# Patient Record
Sex: Female | Born: 1950 | Race: White | Hispanic: No | Marital: Single | Smoking: Never smoker
Health system: Southern US, Community
[De-identification: ages and names within clinical notes are randomized; demographics above are authoritative.]

## PROBLEM LIST (undated history)

## (undated) DIAGNOSIS — M1990 Unspecified osteoarthritis, unspecified site: Secondary | ICD-10-CM

## (undated) DIAGNOSIS — I10 Essential (primary) hypertension: Secondary | ICD-10-CM

## (undated) DIAGNOSIS — R0602 Shortness of breath: Secondary | ICD-10-CM

## (undated) DIAGNOSIS — D494 Neoplasm of unspecified behavior of bladder: Secondary | ICD-10-CM

## (undated) DIAGNOSIS — R319 Hematuria, unspecified: Secondary | ICD-10-CM

## (undated) DIAGNOSIS — R55 Syncope and collapse: Principal | ICD-10-CM

## (undated) DIAGNOSIS — N179 Acute kidney failure, unspecified: Secondary | ICD-10-CM

## (undated) DIAGNOSIS — E7800 Pure hypercholesterolemia, unspecified: Secondary | ICD-10-CM

## (undated) DIAGNOSIS — C679 Malignant neoplasm of bladder, unspecified: Secondary | ICD-10-CM

## (undated) HISTORY — DX: Malignant neoplasm of bladder, unspecified: C67.9

---

## 1998-05-22 ENCOUNTER — Emergency Department (HOSPITAL_COMMUNITY): Admission: EM | Admit: 1998-05-22 | Discharge: 1998-05-22 | Payer: Self-pay | Admitting: Emergency Medicine

## 1998-05-28 ENCOUNTER — Encounter: Admission: RE | Admit: 1998-05-28 | Discharge: 1998-05-28 | Payer: Self-pay | Admitting: Obstetrics

## 1998-05-28 ENCOUNTER — Other Ambulatory Visit: Admission: RE | Admit: 1998-05-28 | Discharge: 1998-05-28 | Payer: Self-pay | Admitting: Obstetrics

## 1998-06-03 ENCOUNTER — Encounter: Admission: RE | Admit: 1998-06-03 | Discharge: 1998-06-03 | Payer: Self-pay | Admitting: Hematology and Oncology

## 1998-07-03 ENCOUNTER — Encounter: Admission: RE | Admit: 1998-07-03 | Discharge: 1998-07-03 | Payer: Self-pay | Admitting: Internal Medicine

## 1998-08-27 ENCOUNTER — Encounter: Admission: RE | Admit: 1998-08-27 | Discharge: 1998-08-27 | Payer: Self-pay | Admitting: Hematology and Oncology

## 1998-09-10 ENCOUNTER — Encounter: Admission: RE | Admit: 1998-09-10 | Discharge: 1998-09-10 | Payer: Self-pay | Admitting: Internal Medicine

## 1999-06-13 ENCOUNTER — Emergency Department (HOSPITAL_COMMUNITY): Admission: EM | Admit: 1999-06-13 | Discharge: 1999-06-13 | Payer: Self-pay | Admitting: Emergency Medicine

## 2000-04-18 ENCOUNTER — Encounter: Admission: RE | Admit: 2000-04-18 | Discharge: 2000-04-18 | Payer: Self-pay | Admitting: Obstetrics & Gynecology

## 2000-06-13 ENCOUNTER — Encounter: Admission: RE | Admit: 2000-06-13 | Discharge: 2000-06-13 | Payer: Self-pay | Admitting: Internal Medicine

## 2000-06-29 ENCOUNTER — Encounter: Payer: Self-pay | Admitting: Emergency Medicine

## 2000-06-29 ENCOUNTER — Emergency Department (HOSPITAL_COMMUNITY): Admission: EM | Admit: 2000-06-29 | Discharge: 2000-06-29 | Payer: Self-pay | Admitting: Emergency Medicine

## 2001-05-01 ENCOUNTER — Encounter: Admission: RE | Admit: 2001-05-01 | Discharge: 2001-05-01 | Payer: Self-pay | Admitting: Obstetrics & Gynecology

## 2003-05-28 ENCOUNTER — Encounter: Payer: Self-pay | Admitting: Nephrology

## 2003-05-28 ENCOUNTER — Encounter: Admission: RE | Admit: 2003-05-28 | Discharge: 2003-05-28 | Payer: Self-pay | Admitting: Nephrology

## 2003-06-18 ENCOUNTER — Other Ambulatory Visit: Admission: RE | Admit: 2003-06-18 | Discharge: 2003-06-18 | Payer: Self-pay | Admitting: Nephrology

## 2003-07-11 ENCOUNTER — Encounter: Payer: Self-pay | Admitting: Nephrology

## 2003-07-11 ENCOUNTER — Ambulatory Visit (HOSPITAL_COMMUNITY): Admission: RE | Admit: 2003-07-11 | Discharge: 2003-07-11 | Payer: Self-pay | Admitting: Nephrology

## 2003-09-09 ENCOUNTER — Ambulatory Visit (HOSPITAL_COMMUNITY): Admission: RE | Admit: 2003-09-09 | Discharge: 2003-09-09 | Payer: Self-pay | Admitting: Nephrology

## 2003-09-26 ENCOUNTER — Ambulatory Visit (HOSPITAL_COMMUNITY): Admission: RE | Admit: 2003-09-26 | Discharge: 2003-09-26 | Payer: Self-pay | Admitting: Gastroenterology

## 2011-12-02 ENCOUNTER — Other Ambulatory Visit: Payer: Self-pay

## 2011-12-02 ENCOUNTER — Inpatient Hospital Stay (HOSPITAL_COMMUNITY): Payer: Self-pay

## 2011-12-02 ENCOUNTER — Emergency Department (HOSPITAL_COMMUNITY): Payer: Self-pay

## 2011-12-02 ENCOUNTER — Encounter (HOSPITAL_COMMUNITY): Payer: Self-pay | Admitting: Emergency Medicine

## 2011-12-02 ENCOUNTER — Inpatient Hospital Stay (HOSPITAL_COMMUNITY)
Admission: EM | Admit: 2011-12-02 | Discharge: 2011-12-06 | DRG: 982 | Disposition: A | Discharge status: Home / Self Care | Payer: Self-pay | Attending: Family Medicine | Admitting: Family Medicine

## 2011-12-02 ENCOUNTER — Encounter: Payer: Self-pay | Admitting: *Deleted

## 2011-12-02 ENCOUNTER — Emergency Department (INDEPENDENT_AMBULATORY_CARE_PROVIDER_SITE_OTHER)
Admission: EM | Admit: 2011-12-02 | Discharge: 2011-12-02 | Disposition: A | Discharge status: Nursing Home (Includes ICF) | Payer: Self-pay | Source: Home / Self Care | Attending: Emergency Medicine | Admitting: Emergency Medicine

## 2011-12-02 DIAGNOSIS — R55 Syncope and collapse: Secondary | ICD-10-CM

## 2011-12-02 DIAGNOSIS — E7800 Pure hypercholesterolemia, unspecified: Secondary | ICD-10-CM | POA: Diagnosis present

## 2011-12-02 DIAGNOSIS — E785 Hyperlipidemia, unspecified: Secondary | ICD-10-CM

## 2011-12-02 DIAGNOSIS — W010XXA Fall on same level from slipping, tripping and stumbling without subsequent striking against object, initial encounter: Secondary | ICD-10-CM | POA: Diagnosis present

## 2011-12-02 DIAGNOSIS — G911 Obstructive hydrocephalus: Principal | ICD-10-CM | POA: Diagnosis present

## 2011-12-02 DIAGNOSIS — Y9301 Activity, walking, marching and hiking: Secondary | ICD-10-CM

## 2011-12-02 DIAGNOSIS — S5290XD Unspecified fracture of unspecified forearm, subsequent encounter for closed fracture with routine healing: Secondary | ICD-10-CM

## 2011-12-02 DIAGNOSIS — I10 Essential (primary) hypertension: Secondary | ICD-10-CM

## 2011-12-02 DIAGNOSIS — Y92410 Unspecified street and highway as the place of occurrence of the external cause: Secondary | ICD-10-CM

## 2011-12-02 DIAGNOSIS — Y998 Other external cause status: Secondary | ICD-10-CM

## 2011-12-02 DIAGNOSIS — S52509A Unspecified fracture of the lower end of unspecified radius, initial encounter for closed fracture: Secondary | ICD-10-CM | POA: Diagnosis present

## 2011-12-02 HISTORY — DX: Essential (primary) hypertension: I10

## 2011-12-02 HISTORY — DX: Unspecified osteoarthritis, unspecified site: M19.90

## 2011-12-02 HISTORY — DX: Pure hypercholesterolemia, unspecified: E78.00

## 2011-12-02 LAB — BASIC METABOLIC PANEL
BUN: 15 mg/dL (ref 6–23)
CO2: 22 mEq/L (ref 19–32)
Calcium: 8.6 mg/dL (ref 8.4–10.5)
Chloride: 105 mEq/L (ref 96–112)
Creatinine, Ser: 0.77 mg/dL (ref 0.50–1.10)
GFR calc Af Amer: >90 mL/min (ref >90)
GFR calc non Af Amer: 89 mL/min — ABNORMAL LOW (ref >90)
Glucose, Bld: 118 mg/dL — ABNORMAL HIGH (ref 70–99)
Potassium: 3.7 mEq/L (ref 3.5–5.1)
Sodium: 138 mEq/L (ref 135–145)

## 2011-12-02 LAB — MAGNESIUM: Magnesium: 1.9 mg/dL (ref 1.5–2.5)

## 2011-12-02 LAB — CBC
HCT: 39.1 % (ref 36.0–46.0)
Hemoglobin: 13.2 g/dL (ref 12.0–15.0)
MCH: 28.8 pg (ref 26.0–34.0)
MCHC: 33.8 g/dL (ref 30.0–36.0)
MCV: 85.4 fL (ref 78.0–100.0)
Platelets: 255 10*3/uL (ref 150–400)
RBC: 4.58 MIL/uL (ref 3.87–5.11)
RDW: 14.1 % (ref 11.5–15.5)
WBC: 9.5 10*3/uL (ref 4.0–10.5)

## 2011-12-02 LAB — CARDIAC PANEL(CRET KIN+CKTOT+MB+TROPI)
CK, MB: 2.1 ng/mL (ref 0.3–4.0)
Relative Index: INVALID (ref 0.0–2.5)
Total CK: 90 U/L (ref 7–177)
Troponin I: <0.3 ng/mL (ref <0.30)

## 2011-12-02 LAB — GLUCOSE, CAPILLARY: Glucose-Capillary: 125 mg/dL — ABNORMAL HIGH (ref 70–99)

## 2011-12-02 LAB — POCT I-STAT TROPONIN I: Troponin i, poc: 0 ng/mL (ref 0.00–0.08)

## 2011-12-02 MED ORDER — SODIUM CHLORIDE 0.9 % IV BOLUS (SEPSIS)
1000.0000 mL | Freq: Once | INTRAVENOUS | Status: AC
  Administered 2011-12-02: 1000 mL via INTRAVENOUS

## 2011-12-02 MED ORDER — ACETAMINOPHEN 325 MG PO TABS: 650.0000 mg | ORAL_TABLET | Freq: Four times a day (QID) | ORAL | Status: DC | PRN

## 2011-12-02 MED ORDER — ACETAMINOPHEN 650 MG RE SUPP: 650.0000 mg | Freq: Four times a day (QID) | RECTAL | Status: DC | PRN

## 2011-12-02 MED ORDER — HYDROMORPHONE HCL PF 1 MG/ML IJ SOLN: 1.0000 mg | INTRAMUSCULAR | Status: DC | PRN

## 2011-12-02 MED ORDER — MORPHINE SULFATE 4 MG/ML IJ SOLN: 4.0000 mg | Freq: Once | INTRAMUSCULAR | Status: DC

## 2011-12-02 MED ORDER — ONDANSETRON HCL 4 MG/2ML IJ SOLN: 4.0000 mg | Freq: Three times a day (TID) | INTRAMUSCULAR | Status: AC | PRN

## 2011-12-02 MED ORDER — HYDROCHLOROTHIAZIDE 25 MG PO TABS
12.5000 mg | ORAL_TABLET | Freq: Every day | ORAL | Status: DC
  Filled 2011-12-02: qty 0.5

## 2011-12-02 MED ORDER — SODIUM CHLORIDE 0.9 % IV SOLN
INTRAVENOUS | Status: AC
  Administered 2011-12-02 – 2011-12-03 (×2): via INTRAVENOUS

## 2011-12-02 MED ORDER — IBUPROFEN 200 MG PO TABS
400.0000 mg | ORAL_TABLET | Freq: Once | ORAL | Status: AC
  Administered 2011-12-02: 400 mg via ORAL
  Filled 2011-12-02: qty 2

## 2011-12-02 MED ORDER — HYDROCHLOROTHIAZIDE 12.5 MG PO CAPS
12.5000 mg | ORAL_CAPSULE | Freq: Every day | ORAL | Status: DC
  Administered 2011-12-02 – 2011-12-06 (×5): 12.5 mg via ORAL
  Filled 2011-12-02 (×5): qty 1

## 2011-12-02 MED ORDER — TRAMADOL HCL 50 MG PO TABS
50.0000 mg | ORAL_TABLET | Freq: Four times a day (QID) | ORAL | Status: DC | PRN
  Filled 2011-12-02: qty 1

## 2011-12-02 NOTE — ED Notes (Signed)
Place Pt on heart monitor and on o2 with nasal cannula 2l/m Per MD

## 2011-12-02 NOTE — ED Notes (Signed)
Placed  On  cardiac  monitot  Nasal  o2   At 2  l  /  Min     l  Wrist  Splint  Temporarily  Applied

## 2011-12-02 NOTE — H&P (Signed)
Family Medicine Teaching [facility] Admission History and Physical  Patient name: Taylor Foley Medical record number: [id number] Date of birth: Dec 09, 1950 Age: 60 y.o. Gender: female  Primary Care Provider: unassigned.  Chief Complaint: syncope episode. History of Present Illness: Taylor Foley is a 60 y.o.female comes to ED today after a syncopal episode and a fall. Pt was walking after lunch and had a "black out" when she woke up she was on  the sidewalk (grass/bushes side) and her wrist was very painful. She was oriented and able to call for help and was aid to stand up walked approximately 4 blocks to were her sister lives and explained what happened. After that she was seen in urgent care services who apply an orthotic to her wrist and send her to [facility] ED were she is evaluated and admitted to our service. Pt does not recall dizziness, diaphoresis, palpitations, chest pain, change in vision or difficulty moving her extremities or before or after the episode. No nausea or vomiting. No changes on her urinary or BM habits.   Past Medical History: Past Medical History  Diagnosis Date  . Hypertension   . Hypercholesteremia     Past Surgical History: History reviewed. No pertinent past surgical history.  Social History: History   Lives with son. Unemployed. Helps serving food in shelters.  Social History Main Topics  . Smoking status: Never Smoker   . Smokeless tobacco: None  . Alcohol Use: No  . Drug Use: No  . Sexually Active: No    Family History: History reviewed. No pertinent family history.  Allergies: No Known Allergies   Review Of Systems: Per HPI . Positive for intermittent dizziness, right arm pain that resolved completely 4 months ago. Headaches on the right sided of her head for about a year.  Physical Exam: Filed Vitals:   12/02/11 1927  BP: 156/86  Pulse: 87  Temp: 98.5 F (36.9 C)  Resp: 20   General: alert, cooperative,  appears stated age and no distress HEENT: extra ocular movement intact, sclera clear, anicteric and neck supple with midline trachea Ears: normal extremal auditory canal bilaterally. Presence of  cerumen. TM normal bilaterally Heart: Normal S1S2. RRR. No murmurs, gallops or rubs. No JVD. Lungs: Unlabored breathing,  clear to auscultation, no wheezes or rales,  Abdomen: abdomen is soft without significant tenderness, masses, organomegaly or guarding Extremities: Left wrist with edema, deformity and decreased ROM. Right knee escoriation. No pitting edema.  Neurology: normal without focal findings, mental status, speech normal, alert and oriented x3, fundi are normal, cranial nerves 2-12 intact, reflexes normal and symmetric, sensation grossly normal, finger to nose and cerebellar exam normal and no tremors, cogwheeling or rigidity noted Gait not explored.   Labs and Imaging: Lab Results  Component Value Date/Time   NA 138 12/02/2011  2:24 PM   K 3.7 12/02/2011  2:24 PM   CL 105 12/02/2011  2:24 PM   CO2 22 12/02/2011  2:24 PM   BUN 15 12/02/2011  2:24 PM   CREATININE 0.77 12/02/2011  2:24 PM   GLUCOSE 118* 12/02/2011  2:24 PM   Lab Results  Component Value Date   WBC 9.5 12/02/2011   HGB 13.2 12/02/2011   HCT 39.1 12/02/2011   MCV 85.4 12/02/2011   PLT 255 12/02/2011    Assessment and Plan: Taylor Foley is a 60 y.o. female presenting with syncopal episode and a fall that caused left wrist fracture. 1. Syncope: Pt had episodes of dizziness in the  past that had been resolved by sitting down. Also has a hx of HTN and HLD not treated for 8 years. No chest pain, palpitations, diaphoresis, around the event, or present in ROS. Had mentioned ocasional headache right sided not associated with nausea/ vomiting or vision changes. No changes on hearing. The differential is broad but with this PMH of not medical care for many years cardiovascular and neurologic causes are priority. The most  common cause is vasovagal, orthostasis, arrhythmias and neurovascular as few to consider.  Plan: -Admit on telemetry floor with cardiac monitoring. -Orthostatics on admission.  -Cardiac Enzymes -ECHO 2.   Fall: left wrist fracture and right leg(knee) excoriation. Also pt mention she has headache and is not clear if she hit her head with the fall, no signs of head trauma and Neurologically she is intact but will order CT to r/o intracranial bleeding. Plan: -Ortho Dr. Ophelia Charter was consulted by ED but we will double check and make sure a consult is placed. Will follow ortho recs - Pain management with ibuprofen and tramadol 3.  HTN: Not treated for several years  -Will start with HCTZ and will monitor and adjust treatment. 4.  HLD:  -Will check lipid panel. 5. FEN/GI:healthy diet and NPO after midnight. 6. Prophylaxis: SCD's if no bleeding confirmed by stable CBC and negative CT scan, evaluate for heparin s/c 5000 units TID. 7. Disposition: Pending improvement and social Investment banker, operational help with financial situation regarding medical care.   D. Piloto Sherron Flemings Paz. MD PGY-1 FM  Pager [phone]   PGY-2 Addendum  I have seen and evaluated the patient and agree with above findings, assessment and plan.  In addition spoke to patient about obtaining orange card for financial assistance and informed of [facility] since she does not have a PCP.  Will have CSW follow along.

## 2011-12-02 NOTE — ED Notes (Signed)
Patient transported to X-ray 

## 2011-12-02 NOTE — ED Notes (Signed)
3003-01 READY

## 2011-12-02 NOTE — ED Notes (Signed)
Pt from [facility] c/o syncopal episode today while walking home from soup kitchen; pt denies dizziness prior to event; pt with deformity to left wrist; CMS intact; pt sts woke up on ground and was able to get back up and walk to sisters house; pt sts dizziness intermittently x several months with pain in right side of neck to right arm at times; pt also wants to speak to social worker

## 2011-12-02 NOTE — ED Notes (Signed)
Pt    Apparently   Passed  Out  About  1  Hr   Ago        Fell  And  inj  Her  l  Arm        l  Wrist  Is  swollen  And  rom  Is  Limited     Abrasion      To  l  Hand       Pt  Apparently  Landed  On l  Arm    At this  Time  Pt is  Awake  And  Alert       Neuro  Intact    denys  Any  Chest    Pain   At  This  Time no  Shortness  Of  Breath           Skin is  Warm  Dry      Pt  Apparently    Was   Walking  Back  From  [employer]       And  [employer]

## 2011-12-02 NOTE — Consult Note (Signed)
Clinical Social Work was called to unit after patient requested to speak about Medicaid Disability.  Patient reports that she had family medicaid when her son was younger, but when he turned 25 they were dropped.  CSW explained the was protocol and patient reports she wants to get back on Medicaid.  Patient reports she is working towards disability and wanting more resources, thus CSW educated and provided resources.  Patient reports she lives at home with her son, in a home which she inherited from her father.  Patient also has a sister who pays the taxes on the house so patient and son will not be homeless.  Sister works as a Engineer, structural and patient son works at [employer] as a Personnel officer boy.  Patient reports she gets meals weekly at [employer] at the [employer] where she fell.  This is why she came into the hospital, brought by her sister.  Patient wants to go back home at dc but thinks hospital will make her stay tonight.  Resources given to patient.  No other needs per her report except for medication in which will be evaluated at dc when medications are given.  Will s/o.  Ashley Jacobs, MSW LCSW [phone]

## 2011-12-02 NOTE — ED Notes (Signed)
Pt reporting dizziness x several weeks. Pt reporting not taking bp medications x 8 years. Pt had syncopal episode this morning. At this time pt alert and oriented. No neuro deficits noted.

## 2011-12-02 NOTE — ED Notes (Signed)
Ortho tech called back. On way down

## 2011-12-02 NOTE — ED Notes (Signed)
Pt  Also  Has  An  Abrasion  r   Knee     Pt  denys  Any  Neck  Or  Back  Pain  She  Does  However   Reports  Some  Blurred  Vision  As  Well

## 2011-12-02 NOTE — Progress Notes (Signed)
Orthopedic Tech Progress Note Patient Details:  Taylor Foley May 25, 1951 [id number]  Type of Splint: [place] Splint Location: left arm Splint Interventions: Application    Nikki Dom 12/02/2011, 7:05 PM

## 2011-12-02 NOTE — ED Provider Notes (Signed)
History    60yf sent form [facility] for further evaluation after a fall. Pt says was just walking home from soup kitchen and next thing remembers was waking up on ground with her L hand in a lot of pain. Pt walked to sisters house afterwards. Pt with no prodrome. Denies preceding CP, palpitations, SOB, dizziness or lightheadedness. No n/v. Currently no complaints aside from pain in LUE and abrasion to R knee. Denies hx of seizure. Hx of HTN and HLD but not taking meds because cannot afford. No fever or chills. Says for past year has been having intermittent dull ache and numbness in R neck, R chest and RUE associated with dizziness. These episodes becoming more frequent. Has not had prior eval for this.  CSN: [id number]  Arrival date & time 12/02/11  1343   First MD Initiated Contact with Patient 12/02/11 1359      Chief Complaint  Patient presents with  . Loss of Consciousness  . Dizziness    (Consider location/radiation/quality/duration/timing/severity/associated sxs/prior treatment) HPI  Past Medical History  Diagnosis Date  . Hypertension   . Hypercholesteremia     History reviewed. No pertinent past surgical history.  History reviewed. No pertinent family history.  History  Substance Use Topics  . Smoking status: Never Smoker   . Smokeless tobacco: Not on file  . Alcohol Use: No    OB History    Grav Para Term Preterm Abortions TAB SAB Ect Mult Living                  Review of Systems   Review of symptoms negative unless otherwise noted in HPI.   Allergies  Review of patient's allergies indicates no known allergies.  Home Medications  No current outpatient prescriptions on file.  BP 174/72  Pulse 81  Temp(Src) 97.2 F (36.2 C) (Oral)  Resp 20  SpO2 100%  Physical Exam  Nursing note and vitals reviewed. Constitutional: She is oriented to person, place, and time. She appears well-developed and well-nourished. No distress.       Sitting up in bed. NAD.    HENT:  Head: Normocephalic and atraumatic.  Eyes: Conjunctivae are normal. Pupils are equal, round, and reactive to light. Right eye exhibits no discharge. Left eye exhibits no discharge.  Neck: Normal range of motion. Neck supple.  Cardiovascular: Normal rate, regular rhythm and normal heart sounds.  Exam reveals no gallop and no friction rub.   No murmur heard. Pulmonary/Chest: Effort normal and breath sounds normal. No respiratory distress.  Abdominal: Soft. She exhibits no distension. There is no tenderness.  Musculoskeletal: She exhibits no edema and no tenderness.       No midline spinal tenderness. Tenderness and swelling distal L wrist/hand. Neurovascularly intact distally. Skin intact.  Lymphadenopathy:    She has no cervical adenopathy.  Neurological: She is alert and oriented to person, place, and time. No cranial nerve deficit. She exhibits normal muscle tone. Coordination normal.  Skin: Skin is warm and dry. She is not diaphoretic.       Superficial abrasions to R knee, fingers L hand and R face.  Psychiatric: She has a normal mood and affect. Her behavior is normal. Thought content normal.    ED Course  Procedures (including critical care time)  Labs Reviewed  BASIC METABOLIC PANEL - Abnormal; Notable for the following:    Glucose, Bld 118 (*)    GFR calc non Af Amer 89 (*)    All other components within normal  limits  CBC  MAGNESIUM  POCT I-STAT TROPONIN I  I-STAT TROPONIN I   Dg Chest 2 View  12/02/2011  *RADIOLOGY REPORT*  Clinical Data: Syncope.  Hypertension.  Fall.  CHEST - 2 VIEW  Comparison: None.  Findings: Heart size is normal.  Mediastinal shadows are normal. Lungs are clear.  The vascularity is normal.  No effusions. Ordinary degenerative changes effect the spine.  IMPRESSION: No active disease.  Original Report Authenticated By: Thomasenia Sales, M.D.   Dg Wrist Complete Left  12/02/2011  *RADIOLOGY REPORT*  Clinical Data: History of injury from fall.   History of pain.  LEFT WRIST - COMPLETE 3+ VIEW  Comparison: Hand examination of same date.  Findings: There is a comminuted fracture of the distal metaphysis of the distal radius.  There is impaction at the fracture site. On the AP image there is near anatomic alignment.  On the lateral image there is seen to be dorsal displacement and dorsal angulation of the major distal fracture fragment.  There is a fracture of the base of the ulnar styloid with slight proximal and radial displacement of the distal fracture fragment.  There is degenerative spurring of the trapezium at the trapezium - first metacarpal joint. There is slightly osteopenic appearance of the bones.  There is soft tissue swelling.  IMPRESSION: Comminuted fracture distal radius metaphysis.  Fracture of the ulnar styloid.  Per CMS PQRS reporting requirements (PQRS Measure 24): Given the patient's age of greater than 50 and the fracture site (hip, distal radius, or spine), the patient should be tested for osteoporosis using DXA, and the appropriate treatment considered based on the DXA results.  Original Report Authenticated By: Crawford Givens, M.D.   Dg Hand Complete Left  12/02/2011  *RADIOLOGY REPORT*  Clinical Data: History of injury from fall with pain.  LEFT HAND - COMPLETE 3+ VIEW  Comparison: This examination of same date.  Findings: There is a comminuted fracture of the distal metaphysis of the radius with impaction at the fracture site.  There is some of proximal and dorsal displacement and dorsal angulation of the major distal fracture fragment.  There is also fracture of the base of the ulnar styloid with medial proximal displacement.  There is osteopenic appearance of the bones.  No carpal fracture or fracture of metacarpals or phalanges is evident.  There is osteoarthritic degenerative spurring involving the trapezium - first metacarpal joint area and the IP joint of the thumb.  IMPRESSION: Fractures of distal radius and ulnar styloid.  No  carpal metacarpal fracture or phalangeal fracture is evident.  Osteopenic appearance of the bones.  Per CMS PQRS reporting requirements (PQRS Measure 24): Given the patient's age of greater than 50 and the fracture site (hip, distal radius, or spine), the patient should be tested for osteoporosis using DXA, and the appropriate treatment considered based on the DXA results.  Original Report Authenticated By: Crawford Givens, M.D.    EKG:  Rhythm: normal sinus Rate: 75 Axis; normal Intervals: normal ST segments: normal  4:58 PM Discussed case with Dr Ophelia Charter, ortho. Will see pt in consult.  1. Syncope   2. Distal radius fracture       MDM  60yf with syncope.  Consider dysrhythmia, seizure, PE, anemia, lytes, hypogylcemia, vasovagal. EKG NSR. Trop WNL. No incontinence and no hx of seizure.  Labs unremarkable. Low clinical suspicion for PE. Pt with non focal neurological exam but symptoms of intermittent R sided pain/numbness concerning for possible angina, although atypical or  TIA. Given no neuro complaints and nonfocal neuro exam do not feel emergent imaging indicated at this time. Pt with risk factors but social issues preventing compliance with medications and has no medical follow-up. In light of this will admit for further evaluation.  Splint and pain meds for distal radius fx. Discussed with Ophelia Charter, hand surgery, who will see in consult.      Raeford Razor, MD 12/07/11 1416

## 2011-12-02 NOTE — ED Notes (Signed)
Pt is from urgent care and sent here for possible stroke symptoms.  Pt has been dizzy for months and fell today on the way home from the soup kitchen.  Pt has abrasion to right eyelid, abrasion and swelling  To left wrist and splint.  Pt has right knee abrasion and swelling.  Pt has htn but has not had meds in years due to money.     Pt has had this right lateral neck pain that radiates down and into right breast for a while.  Pt not in c-collar because no cervical tenderness per md at urgent care

## 2011-12-02 NOTE — ED Notes (Signed)
1191-47 READY

## 2011-12-02 NOTE — ED Provider Notes (Addendum)
History     CSN: [id number]  Arrival date & time 12/02/11  1202   First MD Initiated Contact with Patient 12/02/11 1203      Chief Complaint  Patient presents with  . Loss of Consciousness    (Consider location/radiation/quality/duration/timing/severity/associated sxs/prior treatment) HPI Comments: Was walking home from kitchen , I just passed out, and fell, do not recall any chest pain or shortness of breat I just went out thas happened a couple moe times in the last couple of weeks" It hurts on my R knee and my Left forearm really hurts the most"   No HA and no neck pains feel ok now"  I nurse told me to come here if I ever passed out again, so I did"  Patient is a 60 y.o. female presenting with syncope.  Loss of Consciousness This is a recurrent problem. The current episode started 1 to 2 hours ago. The problem occurs rarely. The problem has been resolved. Pertinent negatives include no chest pain, no abdominal pain, no headaches and no shortness of breath.    Past Medical History  Diagnosis Date  . Hypertension   . Hypercholesteremia     History reviewed. No pertinent past surgical history.  No family history on file.  History  Substance Use Topics  . Smoking status: Never Smoker   . Smokeless tobacco: Not on file  . Alcohol Use: No    OB History    Grav Para Term Preterm Abortions TAB SAB Ect Mult Living                  Review of Systems  Constitutional: Negative for fever and diaphoresis.  HENT: Negative for neck pain.   Respiratory: Negative for chest tightness and shortness of breath.   Cardiovascular: Positive for syncope. Negative for chest pain and palpitations.  Gastrointestinal: Negative for nausea and abdominal pain.  Neurological: Negative for weakness, numbness and headaches.    Allergies  Review of patient's allergies indicates no known allergies.  Home Medications  No current outpatient prescriptions on file.  BP 150/80  Pulse 77   Temp(Src) 97.6 F (36.4 C) (Oral)  Resp 20  SpO2 100%  Physical Exam  Constitutional: She appears well-developed. No distress.  HENT:  Head: Normocephalic and atraumatic.  Eyes: Conjunctivae are normal.  Neck: Normal range of motion. Neck supple. No JVD present. Muscular tenderness present. No spinous process tenderness present. No tracheal deviation and normal range of motion present. Thyromegaly present.    Cardiovascular: Normal rate, regular rhythm, normal heart sounds and normal pulses.   Pulmonary/Chest: Effort normal and breath sounds normal. No respiratory distress. She has no decreased breath sounds. She exhibits no tenderness.  Abdominal: Soft.  Musculoskeletal:       Left forearm: She exhibits tenderness, swelling and deformity.       Arms: Neurological: She is alert. She has normal strength. No cranial nerve deficit or sensory deficit. She exhibits normal muscle tone.  Skin: Skin is warm.    ED Course  Procedures (including critical care time)  Labs Reviewed  GLUCOSE, CAPILLARY - Abnormal; Notable for the following:    Glucose-Capillary 125 (*)    All other components within normal limits   Dg Chest 2 View  12/02/2011  *RADIOLOGY REPORT*  Clinical Data: Syncope.  Hypertension.  Fall.  CHEST - 2 VIEW  Comparison: None.  Findings: Heart size is normal.  Mediastinal shadows are normal. Lungs are clear.  The vascularity is normal.  No effusions.  Ordinary degenerative changes effect the spine.  IMPRESSION: No active disease.  Original Report Authenticated By: Thomasenia Sales, M.D.   Dg Wrist Complete Left  12/02/2011  *RADIOLOGY REPORT*  Clinical Data: History of injury from fall.  History of pain.  LEFT WRIST - COMPLETE 3+ VIEW  Comparison: Hand examination of same date.  Findings: There is a comminuted fracture of the distal metaphysis of the distal radius.  There is impaction at the fracture site. On the AP image there is near anatomic alignment.  On the lateral image  there is seen to be dorsal displacement and dorsal angulation of the major distal fracture fragment.  There is a fracture of the base of the ulnar styloid with slight proximal and radial displacement of the distal fracture fragment.  There is degenerative spurring of the trapezium at the trapezium - first metacarpal joint. There is slightly osteopenic appearance of the bones.  There is soft tissue swelling.  IMPRESSION: Comminuted fracture distal radius metaphysis.  Fracture of the ulnar styloid.  Per CMS PQRS reporting requirements (PQRS Measure 24): Given the patient's age of greater than 50 and the fracture site (hip, distal radius, or spine), the patient should be tested for osteoporosis using DXA, and the appropriate treatment considered based on the DXA results.  Original Report Authenticated By: Crawford Givens, M.D.   Dg Hand Complete Left  12/02/2011  *RADIOLOGY REPORT*  Clinical Data: History of injury from fall with pain.  LEFT HAND - COMPLETE 3+ VIEW  Comparison: This examination of same date.  Findings: There is a comminuted fracture of the distal metaphysis of the radius with impaction at the fracture site.  There is some of proximal and dorsal displacement and dorsal angulation of the major distal fracture fragment.  There is also fracture of the base of the ulnar styloid with medial proximal displacement.  There is osteopenic appearance of the bones.  No carpal fracture or fracture of metacarpals or phalanges is evident.  There is osteoarthritic degenerative spurring involving the trapezium - first metacarpal joint area and the IP joint of the thumb.  IMPRESSION: Fractures of distal radius and ulnar styloid.  No carpal metacarpal fracture or phalangeal fracture is evident.  Osteopenic appearance of the bones.  Per CMS PQRS reporting requirements (PQRS Measure 24): Given the patient's age of greater than 50 and the fracture site (hip, distal radius, or spine), the patient should be tested for  osteoporosis using DXA, and the appropriate treatment considered based on the DXA results.  Original Report Authenticated By: Crawford Givens, M.D.     No diagnosis found.    MDM  Syncopal episode - with multiple area of injury- Left wrist-R knee and abrasions. Reports have been having these episodes in the last few weeks, mentioned to congregational nurse.        Jimmie Molly, MD 12/02/11 1746  Jimmie Molly, MD 12/02/11 [phone]

## 2011-12-03 LAB — CARDIAC PANEL(CRET KIN+CKTOT+MB+TROPI)
CK, MB: 1.7 ng/mL (ref 0.3–4.0)
CK, MB: 1.8 ng/mL (ref 0.3–4.0)
Relative Index: INVALID (ref 0.0–2.5)
Relative Index: INVALID (ref 0.0–2.5)
Total CK: 72 U/L (ref 7–177)
Total CK: 86 U/L (ref 7–177)
Troponin I: <0.3 ng/mL (ref <0.30)
Troponin I: <0.3 ng/mL (ref <0.30)

## 2011-12-03 LAB — LIPID PANEL
HDL: 65 mg/dL (ref >39)
Triglycerides: 52 mg/dL (ref <150)

## 2011-12-03 LAB — TSH: TSH: 0.833 u[IU]/mL (ref 0.350–4.500)

## 2011-12-03 MED ORDER — SIMVASTATIN 20 MG PO TABS
20.0000 mg | ORAL_TABLET | Freq: Every day | ORAL | Status: DC
  Administered 2011-12-03 – 2011-12-06 (×4): 20 mg via ORAL
  Filled 2011-12-03 (×4): qty 1

## 2011-12-03 MED ORDER — CEFAZOLIN SODIUM 1-5 GM-% IV SOLN
1.0000 g | Freq: Once | INTRAVENOUS | Status: AC
  Administered 2011-12-04: 1 g via INTRAVENOUS
  Filled 2011-12-03: qty 50

## 2011-12-03 MED ORDER — IBUPROFEN 400 MG PO TABS
400.0000 mg | ORAL_TABLET | Freq: Four times a day (QID) | ORAL | Status: DC | PRN
  Administered 2011-12-05 – 2011-12-06 (×2): 400 mg via ORAL
  Filled 2011-12-03 (×3): qty 1

## 2011-12-03 NOTE — Progress Notes (Signed)
I interviewed and examined this patient and discussed the care plan with Dr. Aviva Signs and the [facility] team and agree with assessment and plan as documented in the Admission note for today.    Carlitos Bottino A. Sheffield Slider, MD Family Medicine Teaching Service Attending  12/03/2011 2:28 PM

## 2011-12-03 NOTE — Consult Note (Signed)
Reason for Consult:left wrist Fracture   Referring Physician: Dr Eustace Moore Taylor Foley is an 60 y.o. female with left distal radius and ulna styloid Fracture HPI: walking home from soup kitchen  ( she does not drive), and had gone about 4 blocks and passed out.   Had not been taking HTN meds , cholesterol meds.   No past Hx of syncope.    Splinted by Ortho tech with shortened , angulated distal radius fracture.    Past Medical History  Diagnosis Date  . Hypertension   . Hypercholesteremia   . Arthritis     History reviewed. No pertinent past surgical history.  History reviewed. No pertinent family history.  Social History:  reports that she has never smoked. She does not have any smokeless tobacco history on file. She reports that she does not drink alcohol or use illicit drugs.  Allergies: No Known Allergies  Medications: I have reviewed the patient's current medications.  Results for orders placed during the hospital encounter of 12/02/11 (from the past 48 hour(s))  CBC     Status: Normal   Collection Time   12/02/11  2:24 PM      Component Value Range Comment   WBC 9.5  4.0 - 10.5 (K/uL)    RBC 4.58  3.87 - 5.11 (MIL/uL)    Hemoglobin 13.2  12.0 - 15.0 (g/dL)    HCT 09.8  11.9 - 14.7 (%)    MCV 85.4  78.0 - 100.0 (fL)    MCH 28.8  26.0 - 34.0 (pg)    MCHC 33.8  30.0 - 36.0 (g/dL)    RDW 82.9  56.2 - 13.0 (%)    Platelets 255  150 - 400 (K/uL)   BASIC METABOLIC PANEL     Status: Abnormal   Collection Time   12/02/11  2:24 PM      Component Value Range Comment   Sodium 138  135 - 145 (mEq/L)    Potassium 3.7  3.5 - 5.1 (mEq/L)    Chloride 105  96 - 112 (mEq/L)    CO2 22  19 - 32 (mEq/L)    Glucose, Bld 118 (*) 70 - 99 (mg/dL)    BUN 15  6 - 23 (mg/dL)    Creatinine, Ser 8.65  0.50 - 1.10 (mg/dL)    Calcium 8.6  8.4 - 10.5 (mg/dL)    GFR calc non Af Amer 89 (*) >90 (mL/min)    GFR calc Af Amer >90  >90 (mL/min)   MAGNESIUM     Status: Normal   Collection  Time   12/02/11  2:24 PM      Component Value Range Comment   Magnesium 1.9  1.5 - 2.5 (mg/dL)   POCT I-STAT TROPONIN I     Status: Normal   Collection Time   12/02/11  2:56 PM      Component Value Range Comment   Troponin i, poc 0.00  0.00 - 0.08 (ng/mL)    Comment 3            CARDIAC PANEL(CRET KIN+CKTOT+MB+TROPI)     Status: Normal   Collection Time   12/02/11  7:34 PM      Component Value Range Comment   Total CK 90  7 - 177 (U/L)    CK, MB 2.1  0.3 - 4.0 (ng/mL)    Troponin I <0.30  <0.30 (ng/mL)    Relative Index RELATIVE INDEX IS INVALID  0.0 - 2.5  MRSA PCR SCREENING     Status: Normal   Collection Time   12/03/11 12:20 AM      Component Value Range Comment   MRSA by PCR NEGATIVE  NEGATIVE    CARDIAC PANEL(CRET KIN+CKTOT+MB+TROPI)     Status: Normal   Collection Time   12/03/11  4:00 AM      Component Value Range Comment   Total CK 72  7 - 177 (U/L)    CK, MB 1.7  0.3 - 4.0 (ng/mL)    Troponin I <0.30  <0.30 (ng/mL)    Relative Index RELATIVE INDEX IS INVALID  0.0 - 2.5    LIPID PANEL     Status: Abnormal   Collection Time   12/03/11  4:00 AM      Component Value Range Comment   Cholesterol 219 (*) 0 - 200 (mg/dL)    Triglycerides 52  <454 (mg/dL)    HDL 65  >09 (mg/dL)    Total CHOL/HDL Ratio 3.4      VLDL 10  0 - 40 (mg/dL)    LDL Cholesterol 811 (*) 0 - 99 (mg/dL)   TSH     Status: Normal   Collection Time   12/03/11  4:00 AM      Component Value Range Comment   TSH 0.833  0.350 - 4.500 (uIU/mL)   CARDIAC PANEL(CRET KIN+CKTOT+MB+TROPI)     Status: Normal   Collection Time   12/03/11 11:55 AM      Component Value Range Comment   Total CK 86  7 - 177 (U/L)    CK, MB 1.8  0.3 - 4.0 (ng/mL)    Troponin I <0.30  <0.30 (ng/mL)    Relative Index RELATIVE INDEX IS INVALID  0.0 - 2.5      Dg Chest 2 View  12/02/2011  *RADIOLOGY REPORT*  Clinical Data: Syncope.  Hypertension.  Fall.  CHEST - 2 VIEW  Comparison: None.  Findings: Heart size is normal.   Mediastinal shadows are normal. Lungs are clear.  The vascularity is normal.  No effusions. Ordinary degenerative changes effect the spine.  IMPRESSION: No active disease.  Original Report Authenticated By: Thomasenia Sales, M.D.   Dg Wrist Complete Left  12/02/2011  *RADIOLOGY REPORT*  Clinical Data: History of injury from fall.  History of pain.  LEFT WRIST - COMPLETE 3+ VIEW  Comparison: Hand examination of same date.  Findings: There is a comminuted fracture of the distal metaphysis of the distal radius.  There is impaction at the fracture site. On the AP image there is near anatomic alignment.  On the lateral image there is seen to be dorsal displacement and dorsal angulation of the major distal fracture fragment.  There is a fracture of the base of the ulnar styloid with slight proximal and radial displacement of the distal fracture fragment.  There is degenerative spurring of the trapezium at the trapezium - first metacarpal joint. There is slightly osteopenic appearance of the bones.  There is soft tissue swelling.  IMPRESSION: Comminuted fracture distal radius metaphysis.  Fracture of the ulnar styloid.  Per CMS PQRS reporting requirements (PQRS Measure 24): Given the patient's age of greater than 50 and the fracture site (hip, distal radius, or spine), the patient should be tested for osteoporosis using DXA, and the appropriate treatment considered based on the DXA results.  Original Report Authenticated By: Crawford Givens, M.D.   Ct Head Wo Contrast  12/02/2011  *RADIOLOGY REPORT*  Clinical Data: Syncope and  fall, striking head.  Pain in the posterior head.  Intermittent headaches for 1 year.  CT HEAD WITHOUT CONTRAST  Technique:  Contiguous axial images were obtained from the base of the skull through the vertex without contrast.  Comparison: None.  Findings: Diffuse cerebral atrophy.  Low attenuation changes in the deep white matter consistent with small vessel ischemic change. Ventricular dilatation  is likely due to central atrophy but is somewhat out of proportion to the degree of parenchymal atrophy and may represent normal pressure hydrocephalus.  Cavum septum vergae. Prominent CSF space in the posterior fossa suggesting arachnoid cyst or prominent cisterna magna.  No other abnormal extra-axial fluid collections suggested.  No mass effect or midline shift. Gray-white matter junctions are distinct.  Basal cisterns are not effaced.  No evidence of acute intracranial hemorrhage.  Vascular calcifications in the intracranial arteries.  Visualized paranasal sinuses are not opacified.  No depressed skull fractures.  IMPRESSION: Diffuse atrophy.  Diffuse ventricular dilatation which might represent central atrophy although pathologic hydrocephalus is not excluded.  Consider MRI for further evaluation if symptoms persist. Prominent cisterna magna versus arachnoid cyst in the posterior fossa.  No evidence of acute intracranial hemorrhage, acute infarct, or mass lesion.  Original Report Authenticated By: Marlon Pel, M.D.   Dg Hand Complete Left  12/02/2011  *RADIOLOGY REPORT*  Clinical Data: History of injury from fall with pain.  LEFT HAND - COMPLETE 3+ VIEW  Comparison: This examination of same date.  Findings: There is a comminuted fracture of the distal metaphysis of the radius with impaction at the fracture site.  There is some of proximal and dorsal displacement and dorsal angulation of the major distal fracture fragment.  There is also fracture of the base of the ulnar styloid with medial proximal displacement.  There is osteopenic appearance of the bones.  No carpal fracture or fracture of metacarpals or phalanges is evident.  There is osteoarthritic degenerative spurring involving the trapezium - first metacarpal joint area and the IP joint of the thumb.  IMPRESSION: Fractures of distal radius and ulnar styloid.  No carpal metacarpal fracture or phalangeal fracture is evident.  Osteopenic  appearance of the bones.  Per CMS PQRS reporting requirements (PQRS Measure 24): Given the patient's age of greater than 50 and the fracture site (hip, distal radius, or spine), the patient should be tested for osteoporosis using DXA, and the appropriate treatment considered based on the DXA results.  Original Report Authenticated By: Crawford Givens, M.D.    Review of Systems  Constitutional: Negative.   Respiratory: Negative.   Cardiovascular:       Pos. HTN  Gastrointestinal: Negative.   Genitourinary: Negative.   Neurological:       Syncope with fall and current left wrist fracture   Blood pressure 172/97, pulse 73, temperature 98.1 F (36.7 C), temperature source Oral, resp. rate 17, height 5\' 8"  (1.727 m), weight 87.091 kg (192 lb), SpO2 97.00%. Physical Exam  Constitutional: She appears well-developed.  HENT:  Head: Normocephalic.  Eyes: Pupils are equal, round, and reactive to light.  Neck: Normal range of motion. Neck supple.  Cardiovascular: Normal rate and regular rhythm.   Respiratory: Effort normal and breath sounds normal.  GI: Soft.  Musculoskeletal: Normal range of motion.       Left wrist spinted with sugartong splint, sensation normal  Neurological: She is alert.  Skin: Skin is warm.  Psychiatric: She has a normal mood and affect. Her behavior is normal.  Assessment/Plan:left distal radius fracture, displaced and angulated. Will Plate in AM with ORIF.  Discussed procedure with patient, plan, risks, including infection, hardware problems, hardware removal,  Tendon rupture,  Swelling and stiffness.  All ?'s answered she understands and agrees to proceed. Surgery at 7:30 AM.  NPO after MN.   Taylor Foley C 12/03/2011, 3:12 PM

## 2011-12-03 NOTE — Progress Notes (Signed)
PGY-1 Daily Progress Note Family Medicine Teaching Service D. Piloto Rolene Arbour, MD Service Pager: [phone]  Patient name: Taylor Foley  Medical record [id number] Date of birth:04-Mar-1951 Age: 60 y.o. Gender: female  LOS: 1 day   Subjective: feeling better only mild pain.   Objective:  Vitals: Temp:  [97.2 F (36.2 C)-98.9 F (37.2 C)] 98.1 F (36.7 C) (12/29 0500) Pulse Rate:  [68-87] 73  (12/29 0500) Resp:  [17-20] 17  (12/29 0500) BP: (150-174)/(72-97) 172/97 mmHg (12/29 0500) SpO2:  [95 %-100 %] 97 % (12/29 0500) Weight:  [192 lb (87.091 kg)] 192 lb (87.091 kg) (12/28 2030)   Physical Exam: General: alert, cooperative, appears stated age and no distress  HEENT: extra ocular movement intact, sclera clear, anicteric and neck supple with midline trachea Ears: normal extremal auditory canal bilaterally. Presence of cerumen. TM normal bilaterally  Heart: Normal S1S2. RRR. No murmurs, gallops or rubs. No JVD.  Lungs: Unlabored breathing, clear to auscultation, no wheezes or rales,  Abdomen: abdomen is soft without significant tenderness, masses, organomegaly or guarding  Extremities: Left wrist with edema, deformity and decreased ROM. Right knee escoriation. No pitting edema.  Neurology: normal without focal findings, mental status, speech normal, alert and oriented x3, fundi are normal, cranial nerves 2-12 intact, reflexes normal and symmetric, sensation grossly normal, finger to nose and cerebellar exam normal and no tremors, cogwheeling or rigidity noted  Gait:    Labs and imaging:  CBC  Lab 12/02/11 1424  WBC 9.5  HGB 13.2  HCT 39.1  PLT 255   BMET  Lab 12/02/11 1424  NA 138  K 3.7  CL 105  CO2 22  BUN 15  CREATININE 0.77  LABGLOM --  GLUCOSE 118*  CALCIUM 8.6   MAGNESIUM     Status: Normal   Collection Time   12/02/11  2:24 PM      Component Value Range   Magnesium 1.9  1.5 - 2.5 (mg/dL)  POCT I-STAT TROPONIN I     Status: Normal   Collection  Time   12/02/11  2:56 PM      Component Value Range   Troponin i, poc 0.00  0.00 - 0.08 (ng/mL)   Comment 3           CARDIAC PANEL(CRET KIN+CKTOT+MB+TROPI)     Status: Normal   Collection Time   12/02/11  7:34 PM      Component Value Range   Total CK 90  7 - 177 (U/L)   CK, MB 2.1  0.3 - 4.0 (ng/mL)   Troponin I <0.30  <0.30 (ng/mL)   Relative Index RELATIVE INDEX IS INVALID  0.0 - 2.5   MRSA PCR SCREENING     Status: Normal   Collection Time   12/03/11 12:20 AM      Component Value Range   MRSA by PCR NEGATIVE  NEGATIVE   CARDIAC PANEL(CRET KIN+CKTOT+MB+TROPI)     Status: Normal   Collection Time   12/03/11  4:00 AM      Component Value Range   Total CK 72  7 - 177 (U/L)   CK, MB 1.7  0.3 - 4.0 (ng/mL)   Troponin I <0.30  <0.30 (ng/mL)   Relative Index RELATIVE INDEX IS INVALID  0.0 - 2.5   LIPID PANEL     Status: Abnormal   Collection Time   12/03/11  4:00 AM      Component Value Range   Cholesterol 219 (*) 0 - 200 (  mg/dL)   Triglycerides 52  <409 (mg/dL)   HDL 65  >81 (mg/dL)   Total CHOL/HDL Ratio 3.4     VLDL 10  0 - 40 (mg/dL)   LDL Cholesterol 191 (*) 0 - 99 (mg/dL)   Dg Chest 2 View 47/82/9562  IMPRESSION: No active disease.  Original Report Authenticated By: Thomasenia Sales, M.D.   Ct Head Wo Contrast 12/02/2011  IMPRESSION: Diffuse atrophy.  Diffuse ventricular dilatation which might represent central atrophy although pathologic hydrocephalus is not excluded.  Consider MRI for further evaluation if symptoms persist. Prominent cisterna magna versus arachnoid cyst in the posterior fossa.  No evidence of acute intracranial hemorrhage, acute infarct, or mass lesion.  Original Report Authenticated By: Marlon Pel, M.D.   Dg Hand Complete Left 12/02/2011   IMPRESSION: Fractures of distal radius and ulnar styloid.  No carpal metacarpal fracture or phalangeal fracture is evident.  Osteopenic appearance of the bones.  Per CMS PQRS reporting requirements (PQRS  Measure 24): Given the patient's age of greater than 50 and the fracture site (hip, distal radius, or spine), the patient should be tested for osteoporosis using DXA, and the appropriate treatment considered based on the DXA results.  Original Report Authenticated By: Crawford Givens, M.D.   Medications: Medication Dose Route Frequency  . 0.9 %  sodium chloride infusion   Intravenous STAT  . acetaminophen (TYLENOL) tablet 650 mg  650 mg Oral Q6H PRN    . acetaminophen (TYLENOL) suppository 650 mg  650 mg Rectal Q6H PRN  . hydrochlorothiazide (MICROZIDE) capsule 12.5 mg  12.5 mg Oral Daily  . ibuprofen (ADVIL,MOTRIN) tablet 400 mg  400 mg Oral Once  . ondansetron (ZOFRAN) injection 4 mg  4 mg Intravenous Q8H PRN  . sodium chloride 0.9 % bolus 1,000 mL  1,000 mL Intravenous Once  . traMADol (ULTRAM) tablet 50 mg  50 mg Oral Q6H PRN  . DISCONTD: hydrochlorothiazide (HYDRODIURIL) tablet 12.5 mg  12.5 mg Oral Daily  . DISCONTD: HYDROmorphone (DILAUDID) injection 1 mg  1 mg Intravenous Q4H PRN  . DISCONTD: morphine 4 MG/ML injection 4 mg  4 mg Intravenous Once   Assessment and Plan: Taylor Foley is a 60 y.o. female admitted yesterday with syncopal episode and a fall that caused left wrist fracture.  Syncope: No events on cardiac monitoring, negative cardiac enzymes and normal EKG. Orthostatics pending. I will assess this personally this afternoon. -ECHO pending. CT head negative for acute infract or mass but positive for Diffuse atrophy and  ventricular dilatation that can no exclude pathologic hydrocephalus. 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 Pt gait is antalgic due to chronic hip pain but doe not resemble a gait disturbance of a "magnetic gait" of Normal Pressure Hydrocephalus. She also does not have urinary incontinence and today we will assess her with a MME as part of work up. MRI of brain will be the next step.  2. Fall: left wrist fracture and right leg  (knee) excoriation.  CT of the head negative for bleeding  -Ortho Dr. Ophelia Charter he will see her today and we will f/u his recommendations - Pain management with ibuprofen and tramadol   3. HTN: Not treated for several years. Elevated yesterday. Her dose of HCTZ has not been given today. We continue t monitor and adjust treatment as needed.  4. HLD:  Elevated cholesterol we will start with statins.  5. FEN/GI:heart healthy diet  6. Prophylaxis: SCD's until ortho consult and establish if  pt is surgical candidate. 7. Disposition: Pending improvement and social Investment banker, operational help with financial situation regarding medical care.    D. Piloto Rolene Arbour, MD PGY1, [facility] Medicine Teaching Service Pager [phone] 12/03/2011

## 2011-12-03 NOTE — Progress Notes (Signed)
Patient admitted to room 3038 at this time. Alert and oriented. Able to understand others and self understood. Declines head to toe assessment. Denies any pain or discomfort at this time.

## 2011-12-03 NOTE — H&P (Signed)
I interviewed and examined this patient and discussed the care plan with Dr. Aviva Signs and the [facility] team and agree with assessment and plan as documented in the admission  note for today.    Ching Rabideau A. Sheffield Slider, MD Family Medicine Teaching Service Attending  12/03/2011 3:00 PM

## 2011-12-04 ENCOUNTER — Inpatient Hospital Stay (HOSPITAL_COMMUNITY): Payer: Self-pay | Admitting: Anesthesiology

## 2011-12-04 ENCOUNTER — Inpatient Hospital Stay (HOSPITAL_COMMUNITY): Payer: Self-pay

## 2011-12-04 ENCOUNTER — Encounter (HOSPITAL_COMMUNITY)
Admission: EM | Disposition: A | Discharge status: Home / Self Care | Payer: Self-pay | Source: Home / Self Care | Attending: Family Medicine

## 2011-12-04 ENCOUNTER — Encounter (HOSPITAL_COMMUNITY): Payer: Self-pay | Admitting: Anesthesiology

## 2011-12-04 HISTORY — PX: ORIF WRIST FRACTURE: SHX2133

## 2011-12-04 SURGERY — OPEN REDUCTION INTERNAL FIXATION (ORIF) WRIST FRACTURE
Anesthesia: General | Site: Wrist | Laterality: Left | Wound class: Clean

## 2011-12-04 MED ORDER — ONDANSETRON HCL 4 MG/2ML IJ SOLN
4.0000 mg | Freq: Four times a day (QID) | INTRAMUSCULAR | Status: DC | PRN
  Administered 2011-12-04 (×2): 4 mg via INTRAVENOUS
  Filled 2011-12-04: qty 2

## 2011-12-04 MED ORDER — ONDANSETRON HCL 4 MG/2ML IJ SOLN
INTRAMUSCULAR | Status: DC | PRN
  Administered 2011-12-04: 4 mg via INTRAVENOUS

## 2011-12-04 MED ORDER — LACTATED RINGERS IV SOLN
INTRAVENOUS | Status: DC | PRN
  Administered 2011-12-04: 07:00:00 via INTRAVENOUS

## 2011-12-04 MED ORDER — PROMETHAZINE HCL 25 MG/ML IJ SOLN: 6.2500 mg | INTRAMUSCULAR | Status: DC | PRN

## 2011-12-04 MED ORDER — METOPROLOL SUCCINATE 12.5 MG HALF TABLET
12.5000 mg | ORAL_TABLET | Freq: Every day | ORAL | Status: DC
  Administered 2011-12-04 – 2011-12-06 (×3): 12.5 mg via ORAL
  Filled 2011-12-04 (×3): qty 1

## 2011-12-04 MED ORDER — HYDROMORPHONE HCL PF 1 MG/ML IJ SOLN: 0.2500 mg | INTRAMUSCULAR | Status: DC | PRN

## 2011-12-04 MED ORDER — CEFAZOLIN SODIUM 1-5 GM-% IV SOLN
INTRAVENOUS | Status: AC
  Filled 2011-12-04: qty 50

## 2011-12-04 MED ORDER — HYDRALAZINE HCL 20 MG/ML IJ SOLN
20.0000 mg | Freq: Four times a day (QID) | INTRAMUSCULAR | Status: DC | PRN
  Administered 2011-12-04: 20 mg via INTRAVENOUS
  Filled 2011-12-04: qty 1

## 2011-12-04 MED ORDER — ONDANSETRON HCL 4 MG PO TABS: 4.0000 mg | ORAL_TABLET | Freq: Four times a day (QID) | ORAL | Status: DC | PRN

## 2011-12-04 MED ORDER — CEFAZOLIN SODIUM 1-5 GM-% IV SOLN
INTRAVENOUS | Status: DC | PRN
  Administered 2011-12-04: 1 g via INTRAVENOUS

## 2011-12-04 MED ORDER — KETOROLAC TROMETHAMINE 30 MG/ML IJ SOLN
30.0000 mg | Freq: Once | INTRAMUSCULAR | Status: AC
  Administered 2011-12-04: 30 mg via INTRAVENOUS
  Filled 2011-12-04 (×2): qty 1

## 2011-12-04 MED ORDER — LACTATED RINGERS IV SOLN: INTRAVENOUS | Status: DC

## 2011-12-04 MED ORDER — MEPERIDINE HCL 25 MG/ML IJ SOLN: 6.2500 mg | INTRAMUSCULAR | Status: DC | PRN

## 2011-12-04 MED ORDER — MIDAZOLAM HCL 5 MG/5ML IJ SOLN
INTRAMUSCULAR | Status: DC | PRN
  Administered 2011-12-04: 1 mg via INTRAVENOUS

## 2011-12-04 MED ORDER — METOPROLOL TARTRATE 12.5 MG HALF TABLET: 12.5000 mg | ORAL_TABLET | Freq: Every day | ORAL | Status: DC

## 2011-12-04 MED ORDER — FENTANYL CITRATE 0.05 MG/ML IJ SOLN
INTRAMUSCULAR | Status: DC | PRN
  Administered 2011-12-04 (×2): 50 ug via INTRAVENOUS

## 2011-12-04 MED ORDER — PROPOFOL 10 MG/ML IV EMUL
INTRAVENOUS | Status: DC | PRN
  Administered 2011-12-04: 200 mg via INTRAVENOUS

## 2011-12-04 MED ORDER — 0.9 % SODIUM CHLORIDE (POUR BTL) OPTIME
TOPICAL | Status: DC | PRN
  Administered 2011-12-04: 1000 mL

## 2011-12-04 SURGICAL SUPPLY — 55 items
BANDAGE ACE 4 STERILE (GAUZE/BANDAGES/DRESSINGS) ×2 IMPLANT
BANDAGE ELASTIC 3 VELCRO ST LF (GAUZE/BANDAGES/DRESSINGS) ×2 IMPLANT
BENZOIN TINCTURE PRP APPL 2/3 (GAUZE/BANDAGES/DRESSINGS) ×2 IMPLANT
BIT DRILL 2 FAST STEP (BIT) ×2 IMPLANT
BIT DRILL 2.5X4 QC (BIT) ×2 IMPLANT
BNDG ESMARK 4X9 LF (GAUZE/BANDAGES/DRESSINGS) ×2 IMPLANT
CLOSURE STERI STRIP 1/2 X4 (GAUZE/BANDAGES/DRESSINGS) ×2 IMPLANT
CLOTH BEACON ORANGE TIMEOUT ST (SAFETY) ×2 IMPLANT
CORDS BIPOLAR (ELECTRODE) ×2 IMPLANT
COVER SURGICAL LIGHT HANDLE (MISCELLANEOUS) ×2 IMPLANT
DRAPE OEC MINIVIEW 54X84 (DRAPES) ×2 IMPLANT
DURAPREP 26ML APPLICATOR (WOUND CARE) ×2 IMPLANT
ELECT REM PT RETURN 9FT ADLT (ELECTROSURGICAL) ×2
ELECTRODE REM PT RTRN 9FT ADLT (ELECTROSURGICAL) ×1 IMPLANT
GLOVE BIOGEL PI IND STRL 7.5 (GLOVE) ×1 IMPLANT
GLOVE BIOGEL PI IND STRL 8 (GLOVE) ×1 IMPLANT
GLOVE BIOGEL PI INDICATOR 7.5 (GLOVE) ×1
GLOVE BIOGEL PI INDICATOR 8 (GLOVE) ×1
GLOVE ECLIPSE 7.0 STRL STRAW (GLOVE) ×2 IMPLANT
GLOVE ORTHO TXT STRL SZ7.5 (GLOVE) ×2 IMPLANT
GOWN PREVENTION PLUS LG XLONG (DISPOSABLE) ×4 IMPLANT
GOWN PREVENTION PLUS XLARGE (GOWN DISPOSABLE) ×2 IMPLANT
K-WIRE 1.6 (WIRE) ×1
K-WIRE FX5X1.6XNS BN SS (WIRE) ×1
KIT BASIN OR (CUSTOM PROCEDURE TRAY) ×2 IMPLANT
KIT ROOM TURNOVER OR (KITS) ×2 IMPLANT
KWIRE FX5X1.6XNS BN SS (WIRE) ×1 IMPLANT
MANIFOLD NEPTUNE II (INSTRUMENTS) ×2 IMPLANT
NEEDLE 22X1 1/2 (OR ONLY) (NEEDLE) ×2 IMPLANT
NS IRRIG 1000ML POUR BTL (IV SOLUTION) ×2 IMPLANT
PACK ORTHO EXTREMITY (CUSTOM PROCEDURE TRAY) ×2 IMPLANT
PAD ARMBOARD 7.5X6 YLW CONV (MISCELLANEOUS) ×2 IMPLANT
PAD CAST 4YDX4 CTTN HI CHSV (CAST SUPPLIES) ×1 IMPLANT
PADDING CAST COTTON 4X4 STRL (CAST SUPPLIES) ×1
PADDING WEBRIL 3 STERILE (GAUZE/BANDAGES/DRESSINGS) ×2 IMPLANT
PADDING WEBRIL 4 STERILE (GAUZE/BANDAGES/DRESSINGS) ×2 IMPLANT
PEG SUBCHONDRAL SMOOTH 2.0X18 (Peg) ×10 IMPLANT
PEG SUBCHONDRAL SMOOTH 2.0X20 (Peg) ×6 IMPLANT
SCREW CORT 3.5X10 LNG (Screw) ×4 IMPLANT
SPLINT FIBERGLASS 4X15 (CAST SUPPLIES) ×2 IMPLANT
SPONGE GAUZE 4X4 12PLY (GAUZE/BANDAGES/DRESSINGS) ×2 IMPLANT
SPONGE LAP 4X18 X RAY DECT (DISPOSABLE) ×2 IMPLANT
STAPLER VISISTAT 35W (STAPLE) ×2 IMPLANT
STRIP CLOSURE SKIN 1/2X4 (GAUZE/BANDAGES/DRESSINGS) ×2 IMPLANT
SUCTION FRAZIER TIP 10 FR DISP (SUCTIONS) ×2 IMPLANT
SUT PROLENE 3 0 PS 1 (SUTURE) ×2 IMPLANT
SUT VIC AB 2-0 CT1 27 (SUTURE) ×2
SUT VIC AB 2-0 CT1 TAPERPNT 27 (SUTURE) ×2 IMPLANT
SUT VIC AB 3-0 X1 27 (SUTURE) ×2 IMPLANT
SUT VICRYL 4-0 PS2 18IN ABS (SUTURE) ×2 IMPLANT
SYR CONTROL 10ML LL (SYRINGE) ×2 IMPLANT
TOWEL OR 17X24 6PK STRL BLUE (TOWEL DISPOSABLE) ×2 IMPLANT
TOWEL OR 17X26 10 PK STRL BLUE (TOWEL DISPOSABLE) ×2 IMPLANT
TUBE CONNECTING 12X1/4 (SUCTIONS) ×2 IMPLANT
UNDERPAD 30X30 INCONTINENT (UNDERPADS AND DIAPERS) ×2 IMPLANT

## 2011-12-04 NOTE — Brief Op Note (Signed)
12/02/2011 - 12/04/2011  8:48 AM  PATIENT:  Taylor Foley  60 y.o. female  PRE-OPERATIVE DIAGNOSIS:  Left distal radius fracture  POST-OPERATIVE DIAGNOSIS:  Left distal radius fracture  PROCEDURE:  Procedure(s): OPEN REDUCTION INTERNAL FIXATION (ORIF) WRIST FRACTURE  SURGEON:  Surgeon(s): Eldred Manges  PHYSICIAN ASSISTANT:   ASSISTANTS: none   ANESTHESIA:   local and general  EBL:     BLOOD ADMINISTERED:none  DRAINS: none   LOCAL MEDICATIONS USED:  MARCAINE 8CC  SPECIMEN:  No Specimen  DISPOSITION OF SPECIMEN:  N/A  COUNTS:  YES  TOURNIQUET:   Total Tourniquet Time Documented: Upper Arm (Left) - 35 minutes  DICTATION: .Other Dictation: Dictation Number [id number]  PLAN OF CARE: Admit to inpatient   PATIENT DISPOSITION:  PACU - hemodynamically stable.   Delay start of Pharmacological VTE agent (>24hrs) due to surgical blood loss or risk of bleeding:  {YES/NO/NOT APPLICABLE:20182

## 2011-12-04 NOTE — Progress Notes (Signed)
I interviewed and examined this patient and discussed the care plan with Dr. Aviva Signs and the [facility] team and agree with assessment and plan as documented in the progress note for today.    Samyra Limb A. Sheffield Slider, MD Family Medicine Teaching Service Attending  12/04/2011 2:31 PM

## 2011-12-04 NOTE — Preoperative (Signed)
Beta Blockers   Reason not to administer Beta Blockers:Not Applicable 

## 2011-12-04 NOTE — Anesthesia Procedure Notes (Signed)
Procedure Name: LMA Insertion Date/Time: 12/04/2011 7:43 AM Performed by: Glendora Score Pre-anesthesia Checklist: Patient identified, Emergency Drugs available, Suction available and Patient being monitored Patient Re-evaluated:Patient Re-evaluated prior to inductionOxygen Delivery Method: Circle System Utilized Preoxygenation: Pre-oxygenation with 100% oxygen Intubation Type: IV induction LMA: LMA with gastric port inserted LMA Size: 4.0 Number of attempts: 1 Placement Confirmation: positive ETCO2 and breath sounds checked- equal and bilateral Tube secured with: Tape Dental Injury: Teeth and Oropharynx as per pre-operative assessment

## 2011-12-04 NOTE — Op Note (Signed)
NAMEMarland Kitchen  MILAYAH, KRELL NO.:  [id number]  MEDICAL RECORD NO.:  [id number]  LOCATION:  [facility]                        FACILITY:  [facility]  PHYSICIAN:  Kethan Papadopoulos C. Ophelia Charter, M.D.    DATE OF BIRTH:  Jan 03, 1951  DATE OF PROCEDURE:  12/04/2011 DATE OF DISCHARGE:                              OPERATIVE REPORT   PREOPERATIVE DIAGNOSIS:  Displaced angulated left distal radius fracture.  POSTOPERATIVE DIAGNOSIS:  Displaced angulated left distal radius fracture.  PROCEDURE:  Open reduction, internal fixation, volar plating, left distal radius fracture, DVR hand innovation plate.  SURGEON:  Clothilde Tippetts C. Ophelia Charter, MD  TOURNIQUET TIME:  27 minutes.  ESTIMATED BLOOD LOSS:  Minimal.  ANESTHESIA:  GOT plus Marcaine 8 mL.  PROCEDURE:  After induction of general anesthesia, orotracheal intubation, prepped and draping with DuraPrep, preoperative Ancef was given.  Time-out procedure was completed.  Stockinette extremity sheets and drapes were applied.  Sterile skin marker was used for planned incision over the FCR tendon.  Hand was wrapped in Esmarch, tourniquet inflated.  Skin was incised.  The anterior FCR sheath was split.  The tendon was pulled toward the radial aspect, taking the radial artery. Posterior sheath was opened.  Pronator was peeled off the radial aspect of the radius sharply with a scalpel and then subperiosteally dissected exposing the fracture.  Fracture was reduced, held, fixed with a K-wire through the styloid, rechecked.  There was still slight radial displacement of the distal fragment.  It was re-reduced after backing the K-wire out, so it was only unicortical and this improved position and alignment corrected the radial displacement and restored the volar tilt.  The K-wire was advanced to hold the fracture.  This was extra- articular, plate was selected, adjusted, held with a K-wire, slot was filled with a single 10 mm screw in the long slot in the plate and the screw  was inserted, plate was adjusted, checked under fluoroscopy. Distal row was filled first followed by the proximal row and smoothed. A 18 and 20 mm pegs were used.  All pegs were in good position.  No penetration into the joint.  Spinal spot pictures were taken after second proximal screw was placed. Wound was irrigated.  Tourniquet deflated.  Pronator was placed back in its regular position.  Subcutaneous tissue reapproximated with 2-0 Vicryl, 3-0 Vicryl subcuticular closure.  Tincture of benzoin, Steri- Strips, Marcaine infiltration, postop dressing, splint was applied and then transferred to recovery room in stable condition.     Brennden Masten C. Ophelia Charter, M.D.     MCY/MEDQ  D:  12/04/2011  T:  12/04/2011  Job:  [id number]

## 2011-12-04 NOTE — Anesthesia Preprocedure Evaluation (Addendum)
Anesthesia Evaluation  Patient identified by MRN, date of birth, ID band Patient awake    Reviewed: Allergy & Precautions, H&P , NPO status , Patient's Chart, lab work & pertinent test results  History of Anesthesia Complications Negative for: history of anesthetic complications  Airway Mallampati: I      Dental  (+) Edentulous Lower   Pulmonary neg pulmonary ROS,  clear to auscultation        Cardiovascular hypertension, Regular Normal    Neuro/Psych    GI/Hepatic   Endo/Other    Renal/GU      Musculoskeletal  (+) Arthritis -,   Abdominal   Peds  Hematology   Anesthesia Other Findings   Reproductive/Obstetrics                         Anesthesia Physical Anesthesia Plan  ASA: II  Anesthesia Plan: General   Post-op Pain Management:    Induction: Intravenous  Airway Management Planned: LMA and Oral ETT  Additional Equipment:   Intra-op Plan:   Post-operative Plan: Extubation in OR  Informed Consent: I have reviewed the patients History and Physical, chart, labs and discussed the procedure including the risks, benefits and alternatives for the proposed anesthesia with the patient or authorized representative who has indicated his/her understanding and acceptance.   Dental advisory given  Plan Discussed with: CRNA, Anesthesiologist and Surgeon  Anesthesia Plan Comments:         Anesthesia Quick Evaluation

## 2011-12-04 NOTE — Transfer of Care (Signed)
Immediate Anesthesia Transfer of Care Note  Patient: Taylor Foley  Procedure(s) Performed:  OPEN REDUCTION INTERNAL FIXATION (ORIF) WRIST FRACTURE  Patient Location: PACU  Anesthesia Type: General  Level of Consciousness: awake, alert  and patient cooperative  Airway & Oxygen Therapy: Patient Spontanous Breathing and Patient connected to face mask oxygen  Post-op Assessment: Report given to PACU RN  Post vital signs: Reviewed and stable  Complications: No apparent anesthesia complications

## 2011-12-04 NOTE — Anesthesia Postprocedure Evaluation (Signed)
  Anesthesia Post-op Note  Patient: Taylor Foley  Procedure(s) Performed:  OPEN REDUCTION INTERNAL FIXATION (ORIF) WRIST FRACTURE  Patient Location: PACU  Anesthesia Type: General  Level of Consciousness: awake  Airway and Oxygen Therapy: Patient Spontanous Breathing  Post-op Pain: mild  Post-op Assessment: Post-op Vital signs reviewed  Post-op Vital Signs: stable  Complications: No apparent anesthesia complications

## 2011-12-04 NOTE — Progress Notes (Signed)
PGY-1 Daily Progress Note Family Medicine Teaching Service D. Piloto Rolene Arbour, MD Service Pager: [phone]  Patient name: Taylor Foley  Medical record [id number] Date of birth:08/09/1951 Age: 60 y.o. Gender: female  LOS: 2 days   Subjective: We see pt today after her intervention in the OR with no apparent complication. PT is a little irritable and does not wants to engage conversation with [place]. Afebrile.  Objective:  Vitals: Temp:  97.9 F (36.6 C) (12/30 0945) Pulse Rate:   82  (12/30 0945) Resp: 18  (12/30 0945) BP:  165/112 mmHg (12/30 0945) SpO2:  92 % (12/30 0945)  Intake/Output Summary (Last 24 hours) at 12/04/11 1237 Last data filed at 12/04/11 0852  Gross per 24 hour  Intake    900 ml  Output      0 ml  Net    900 ml   Physical Exam: General: No in acute distress.  HEENT: Moist mucosa. Heart: Normal S1S2. RRR. No murmurs, gallops or rubs. No JVD.  Lungs: Unlabored breathing, clear to auscultation, no wheezes or rales,  Abdomen: Did not cooperate with abdominal physical exam. Extremities: Left wrist with dressings. Neurology: normal without focal findings.  Labs and imaging:  CBC  Lab 12/02/11 1424  WBC 9.5  HGB 13.2  HCT 39.1  PLT 255   BMET  Lab 12/02/11 1424  NA 138  K 3.7  CL 105  CO2 22  BUN 15  CREATININE 0.77  LABGLOM --  GLUCOSE 118*  CALCIUM 8.6   Medications: Medication Dose Route Frequency  . 0.9 %  sodium chloride infusion   Intravenous STAT  . acetaminophen (TYLENOL) tablet 650 mg  650 mg Oral Q6H PRN    . acetaminophen (TYLENOL) suppository 650 mg  650 mg Rectal Q6H PRN  . ceFAZolin (ANCEF) IVPB 1 g/50 mL premix  1 g Intravenous Once  . hydrALAZINE (APRESOLINE) injection 20 mg  20 mg Intravenous Q6H PRN  . hydrochlorothiazide (MICROZIDE) capsule 12.5 mg  12.5 mg Oral Daily  . ibuprofen (ADVIL,MOTRIN) tablet 400 mg  400 mg Oral Q6H PRN  . ketorolac (TORADOL) 30 MG/ML injection 30 mg  30 mg Intravenous Once  .  metoprolol tartrate (LOPRESSOR) tablet 12.5 mg  12.5 mg Oral Daily  . ondansetron (ZOFRAN) tablet 4 mg  4 mg Oral Q6H PRN    . ondansetron (ZOFRAN) injection 4 mg  4 mg Intravenous Q6H PRN  . simvastatin (ZOCOR) tablet 20 mg  20 mg Oral Daily  . traMADol (ULTRAM) tablet 50 mg  50 mg Oral Q6H PRN    Assessment and Plan: Taylor Foley is a 60 y.o. female admitted yesterday with syncopal episode and a fall that caused left wrist fracture.  Syncope: No events on cardiac monitoring, negative cardiac enzymes and normal EKG. Orthostatics  Negative. -ECHO pending.  CT head negative for acute infract or mass but positive for Diffuse atrophy and ventricular dilatation that can no exclude pathologic hydrocephalus. 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 Pt gait is antalgic due to chronic hip pain but doe not resemble a gait disturbance of a "magnetic gait" of Normal Pressure Hydrocephalus. She also does not have urinary incontinence. -MRI without contrast today  2. Fall: left wrist fracture and right leg (knee) excoriation.  CT of the head negative for bleeding  - Was intervened today by Ortho. We will f/u recs  - Pain management with ibuprofen and tramadol  3. HTN: Not treated for several years. Elevated   -  Continue HCTZ and add metoprolol 12.5 -We continue to monitor and adjust treatment as needed.  4. HLD:  -Continue with statin.  5. FEN/GI:heart healthy diet  6. Prophylaxis: SCD's  7. Disposition: Pending improvement and social Investment banker, operational help with financial situation regarding medical care.    D. Piloto Rolene Arbour, MD PGY1, [facility] Medicine Teaching Service Pager [phone] 12/04/2011

## 2011-12-05 MED ORDER — LACTATED RINGERS IV SOLN: INTRAVENOUS | Status: DC

## 2011-12-05 MED ORDER — OXYCODONE-ACETAMINOPHEN 5-325 MG PO TABS: 1.0000 | ORAL_TABLET | ORAL | Status: DC | PRN

## 2011-12-05 MED ORDER — METOCLOPRAMIDE HCL 5 MG/ML IJ SOLN
5.0000 mg | Freq: Three times a day (TID) | INTRAMUSCULAR | Status: DC | PRN
  Filled 2011-12-05: qty 2

## 2011-12-05 MED ORDER — METOCLOPRAMIDE HCL 5 MG PO TABS
5.0000 mg | ORAL_TABLET | Freq: Three times a day (TID) | ORAL | Status: DC | PRN
  Filled 2011-12-05: qty 2

## 2011-12-05 MED ORDER — HYDROMORPHONE HCL PF 1 MG/ML IJ SOLN: 0.5000 mg | INTRAMUSCULAR | Status: DC | PRN

## 2011-12-05 NOTE — Progress Notes (Signed)
PGY-1 Daily Progress Note Family Medicine Teaching Service D. Piloto Rolene Arbour, MD Service Pager: [phone]  Patient name: Taylor Foley  Medical record [id number] Date of birth:[birth year] Age: 60 y.o. Gender: female  LOS: 3 days   Subjective:  Feeling better very cooperative today with physical exam.  Objective:  Vitals: Temp: 98 F (36.7 C) (12/31 0558) Pulse Rate: 70  (12/31 0558) Resp:  20  (12/31 0558) BP:130/73 mmHg (12/31 0558) SpO2:  [97 %-100 %] 97 % (12/31 0558)  Physical Exam:  General: No in acute distress.  HEENT: Moist mucosa.  Heart: Normal S1S2. RRR. No murmurs, gallops or rubs. No JVD.  Lungs: Unlabored breathing, clear to auscultation, no wheezes or rales,  Abdomen: Soft, no painful to palpation, no guarding. Extremities: Left wrist with dressings.  Neurology: normal without focal findings.   Labs and imaging:  CBC  Lab 12/02/11 1424  WBC 9.5  HGB 13.2  HCT 39.1  PLT 255   BMET  Lab 12/02/11 1424  NA 138  K 3.7  CL 105  CO2 22  BUN 15  CREATININE 0.77  LABGLOM --  GLUCOSE 118*  CALCIUM 8.6   Mr Brain Wo Contrast 12/04/2011    IMPRESSION:  1.  Prominent ventricular dilation without a configuration suggesting hydrocephalus.  This could represent normal pressure hydrocephalus.  Ventricular dilation related to atrophy is favored. 2.  Mild periventricular and scattered diffuse subcortical T2 and FLAIR hyperintensity bilaterally. 3.  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 4.  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  Original Report Authenticated By: Jamesetta Orleans. MATTERN, M.D.   Medications: Medication Dose Route Frequency  . acetaminophen (TYLENOL) tablet 650 mg  650 mg Oral Q6H PRN  . hydrALAZINE (APRESOLINE) injection 20 mg  20 mg Intravenous Q6H PRN  . hydrochlorothiazide (MICROZIDE) capsule 12.5 mg  12.5 mg Oral Daily  . HYDROmorphone (DILAUDID) injection 0.5-1 mg   0.5-1 mg Intravenous Q2H PRN  . ibuprofen (ADVIL,MOTRIN) tablet 400 mg  400 mg Oral Q6H PRN  . lactated ringers infusion   Intravenous Continuous  . metoCLOPramide (REGLAN) tablet 5-10 mg  5-10 mg Oral Q8H PRN  . metoprolol succinate (TOPROL-XL) 24 hr tablet 12.5 mg  12.5 mg Oral Daily  . ondansetron (ZOFRAN) tablet 4 mg  4 mg Oral Q6H PRN  . ondansetron (ZOFRAN) injection 4 mg  4 mg Intravenous Q6H PRN  . oxyCODONE-acetaminophen (PERCOCET) 5-325 MG per tablet 1-2 tablet  1-2 tablet Oral Q4H PRN  . simvastatin (ZOCOR) tablet 20 mg  20 mg Oral Daily  . DISCONTD: traMADol (ULTRAM) tablet 50 mg  50 mg Oral Q6H PRN   Assessment and Plan: Taylor Foley is a 60 y.o. female admitted yesterday with syncopal episode and a fall that caused left wrist fracture.  Syncope: No events on cardiac monitoring, negative cardiac enzymes and normal EKG. Orthostatics Negative.  -ECHO performed today pending results. No events on telemetry prior surgery. Pt was out of telemetry and refuses to be monitored again. We recommended as it is important as arrhthymias can cause syncopal episodes.  CT head negative for acute infract or mass but positive for Diffuse atrophy and ventricular dilatation that can no exclude pathologic hydrocephalus. 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 Pt gait is antalgic due to chronic hip pain but doe not resemble a gait disturbance of a "magnetic gait" of Normal Pressure Hydrocephalus. She also does not have urinary incontinence. She endorses  headaches for the past several years.  -MRI  was positive (see above) so Neurology has been consulted and we will f/u their recommendations.  2.. Left wrist fracture s/p open reduction and internal fixation. - We will f/u Ortho recs  3. HTN: Not treated for several years. Controlled  -Continue HCTZ and add metoprolol 12.5  -We continue to monitor and adjust treatment as needed.  4. HLD:  -Continue with statin.  5.  FEN/GI:heart healthy diet  6. Prophylaxis: SCD's 1 7. Disposition: Pending improvement and social Investment banker, operational help with financial situation regarding medical care.     D. Piloto Rolene Arbour, MD PGY1, [facility] Medicine Teaching Service Pager [phone] 12/05/2011

## 2011-12-05 NOTE — Consult Note (Signed)
[facility] CONSULT NOTE     Reason for Consult: ? NPH CC: ?NPH   HPI:    Taylor Foley is an 60 y.o. female  has a past medical history of Hypertension; Hypercholesteremia; and Arthritis. Comes to ED today after a syncopal episode and a fall. Upon examination from teaching service she had a MRI of brain obtained. The reading stated Prominent ventricular dilation without a configuration suggesting hydrocephalus. This could represent normal pressure hydrocephalus however, ventricular dilation related to atrophy is favored. Neurology was asked to see patient for recommendations.  Patient and son denies any issues with memory, incontinence or gait.    Past Medical History  Diagnosis Date  . Hypertension   . Hypercholesteremia   . Arthritis     History reviewed. No pertinent past surgical history.  History reviewed. No pertinent family history.  Social History:  reports that she has never smoked. She does not have any smokeless tobacco history on file. She reports that she does not drink alcohol or use illicit drugs.  No Known Allergies  Medications:    Prior to Admission:  No prescriptions prior to admission   Scheduled:   . hydrochlorothiazide  12.5 mg Oral Daily  . metoprolol succinate  12.5 mg Oral Daily  . simvastatin  20 mg Oral Daily    Review of Systems - General ROS: negative for - chills, fatigue, fever or hot flashes Hematological and Lymphatic ROS: negative for - bruising, fatigue, jaundice or pallor Endocrine ROS: negative for - hair pattern changes, hot flashes, mood swings or skin changes Respiratory ROS: negative for - cough, hemoptysis, orthopnea or wheezing Cardiovascular ROS: negative for - dyspnea on exertion, orthopnea, palpitations or shortness of breath Gastrointestinal ROS: negative for - abdominal pain, appetite loss, blood in stools, diarrhea or hematemesis Musculoskeletal ROS: negative for - joint pain,  joint stiffness, joint swelling or muscle pain Neurological ROS: positive for - weakness Dermatological ROS: negative for dry skin, pruritus and rash   Blood pressure 130/73, pulse 70, temperature 98 F (36.7 C), temperature source Oral, resp. rate 20, height 5\' 8"  (1.727 m), weight 87.091 kg (192 lb), SpO2 97.00%.   Neurologic Examination:   Mental Status: Alert, oriented,able to calculate without difficulty, spells world backward without problems, names 3 objects, able to count backward with Seriel sevens without difficulty. thought content appropriate.  Speech fluent without evidence of aphasia. Able to follow 3 step commands without difficulty. Cranial Nerves: II-Visual fields grossly intact. III/IV/VI-Extraocular movements intact.  Pupils reactive bilaterally. V/VII-Smile symmetric VIII-grossly intact IX/X-normal gag XI-bilateral shoulder shrug XII-midline tongue extension Motor: 5/5 bilaterally with normal tone and bulk Sensory: Pinprick and light touch intact throughout, bilaterally Deep Tendon Reflexes: 2+ and symmetric throughout Plantars: Downgoing bilaterally Cerebellar: Normal finger-to-nose, normal rapid alternating movements and normal heel-to-shin test.  Normal gait and station.     Lab Results  Component Value Date/Time   CHOL 219* 12/03/2011  4:00 AM    No results found for this or any previous visit (from the past 48 hour(s)).  Mr Brain Wo Contrast  12/04/2011  *RADIOLOGY REPORT*  Clinical Data: Syncope.  Status post fall with head trauma. Diffuse ventricular dilation of prominent cisterna magna versus arachnoid cyst.  Abnormal CT scan.  MRI HEAD WITHOUT CONTRAST  Technique:  Multiplanar, multiecho pulse sequences of the brain and surrounding structures were obtained according to standard protocol without  intravenous contrast.  Comparison: CT of the head 12/02/2011.  Findings: No acute infarct, hemorrhage, or mass lesion is present. Moderate generalized atrophy  is present.  There is prominent dilation of the ventricular system involving the lateral and third ventricles. The fourth ventricle is also dilated.  There is a prominent retrocerebellar CSF space.  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  Given the configuration of the ventricles, this likely represents ventricular dilation associated with cerebral atrophy. There is a persistent cavum of the velum interpositum, a normal variant.  Mild periventricular and scattered subcortical T2 and FLAIR hyperintensities are noted.  Flow is present in the major intracranial arteries.  The globes and orbits are intact.  The paranasal sinuses and mastoid air cells are clear.  IMPRESSION:  1.  Prominent ventricular dilation without a configuration suggesting hydrocephalus.  This could represent normal pressure hydrocephalus.  Ventricular dilation related to atrophy is favored. 2.  Mild periventricular and scattered diffuse subcortical T2 and FLAIR hyperintensity bilaterally. 3.  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 4.  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  Original Report Authenticated By: Jamesetta Orleans. MATTERN, M.D.     Assessment/Plan:   60 YO female with no history of gait shuffling, incontinence or memory issues who was [facility] to [facility]opal episode.  While admitted patient underwent a mri showing prominent ventricular dilation without a configuration suggesting hydrocephalus. This could represent normal pressure hydrocephalus however, ventricular dilation related to atrophy is favored.   Patient is asymptomatic at this time.  Surgery has complications.  Would not do anything at this time unless patient develops symptoms of dementia, incontinence or magnetic gait.   Neurology will S/O   [name] PA-C [facility] [phone]  12/05/2011, 2:07 PM

## 2011-12-05 NOTE — Progress Notes (Signed)
*  PRELIMINARY RESULTS* Echocardiogram 2D Echocardiogram has been performed.  Taylor Foley [facility] 12/05/2011, 10:01 AM

## 2011-12-06 MED ORDER — IBUPROFEN 400 MG PO TABS: 400.0000 mg | ORAL_TABLET | Freq: Four times a day (QID) | ORAL | Status: AC | PRN

## 2011-12-06 MED ORDER — METOPROLOL SUCCINATE 12.5 MG HALF TABLET: 25.0000 mg | ORAL_TABLET | Freq: Every day | ORAL | Status: DC

## 2011-12-06 MED ORDER — SIMVASTATIN 20 MG PO TABS: 40.0000 mg | ORAL_TABLET | Freq: Every day | ORAL | Status: DC

## 2011-12-06 MED ORDER — OXYCODONE-ACETAMINOPHEN 5-325 MG PO TABS: 1.0000 | ORAL_TABLET | ORAL | Status: AC | PRN

## 2011-12-06 MED ORDER — HYDROCHLOROTHIAZIDE 12.5 MG PO CAPS: 12.5000 mg | ORAL_CAPSULE | Freq: Every day | ORAL | Status: DC

## 2011-12-06 MED ORDER — SIMVASTATIN 20 MG PO TABS: 20.0000 mg | ORAL_TABLET | Freq: Every day | ORAL | Status: DC

## 2011-12-06 MED ORDER — HYDROCHLOROTHIAZIDE 12.5 MG PO TABS: 12.5000 mg | ORAL_TABLET | Freq: Every day | ORAL | Status: DC

## 2011-12-06 NOTE — Progress Notes (Signed)
Discussed patient with Orthopedic Surgeon Dr. Ophelia Charter. He agrees that patient can be discharged today to home with follow up in his office in 2-3 days. She will call his office for an appointment.  Taylor Foley 11:45 AM 12/06/2011

## 2011-12-06 NOTE — Progress Notes (Signed)
Pt provided with discharge instructions, give 3 day supply of prescribed meds.   Discharge accompanied by sister/

## 2011-12-06 NOTE — Progress Notes (Signed)
I discussed with Dr Orton.  I agree with their plans documented in their  Note for today.  

## 2011-12-06 NOTE — Discharge Summary (Signed)
I discussed with Dr Orton.  I agree with their plans documented in their  Note for today.  

## 2011-12-06 NOTE — Progress Notes (Signed)
Patient name: Taylor Foley  Medical record [id number] Date of birth:[birth year] Age: 61 y.o. Gender: female  LOS: 4 days   Subjective:  Feeling better, no o/n issues. Wants to be discharged. No falls, syncope, or dizziness/lightheadedness. Minimal pain in left wrist.  Objective:  Vitals: Filed Vitals:   12/05/11 0558 12/05/11 1449 12/05/11 2200 12/06/11 0600  BP: 130/73 131/73 118/74 136/74  Pulse: 70 63 96 76  Temp: 98 F (36.7 C) 98.8 F (37.1 C) 97.7 F (36.5 C) 98.1 F (36.7 C)  TempSrc: Oral Oral    Resp: 20 18 18 18   Height:      Weight:      SpO2: 97% 98% 99% 97%   Physical Exam:  General: No in acute distress.  Extremities: Left wrist with dressings. Fingers mobile, no swelling. NV intact Neurology: no gross deficits. Conversing normally. AOx3  Labs and imaging:  CBC  Lab 12/02/11 1424  WBC 9.5  HGB 13.2  HCT 39.1  PLT 255   BMET  Lab 12/02/11 1424  NA 138  K 3.7  CL 105  CO2 22  BUN 15  CREATININE 0.77  LABGLOM --  GLUCOSE 118*  CALCIUM 8.6   Mr Brain Wo Contrast 12/04/2011    IMPRESSION:  1.  Prominent ventricular dilation without a configuration suggesting hydrocephalus.  This could represent normal pressure hydrocephalus.  Ventricular dilation related to atrophy is favored. 2.  Mild periventricular and scattered diffuse subcortical T2 and FLAIR hyperintensity bilaterally. 3.  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 4.  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  Original Report Authenticated By: Jamesetta Orleans. MATTERN, M.D.   Medications: Current facility-administered medications:acetaminophen (TYLENOL) suppository 650 mg, 650 mg, Rectal, Q6H PRN, Dayarmys Piloto, MD;  acetaminophen (TYLENOL) tablet 650 mg, 650 mg, Oral, Q6H PRN, Dayarmys Piloto, MD;  hydrALAZINE (APRESOLINE) injection 20 mg, 20 mg, Intravenous, Q6H PRN, Edd Arbour, 20 mg at 12/04/11 1035;   hydrochlorothiazide (MICROZIDE) capsule 12.5 mg, 12.5 mg, Oral, Daily, Kendra P Hiatt, PHARMD, 12.5 mg at 12/06/11 1033 HYDROmorphone (DILAUDID) injection 0.5-1 mg, 0.5-1 mg, Intravenous, Q2H PRN, Eldred Manges;  ibuprofen (ADVIL,MOTRIN) tablet 400 mg, 400 mg, Oral, Q6H PRN, Dayarmys Piloto, MD, 400 mg at 12/06/11 1034;  lactated ringers infusion, , Intravenous, Continuous, Josepha Pigg, MD;  metoCLOPramide (REGLAN) injection 5-10 mg, 5-10 mg, Intravenous, Q8H PRN, Eldred Manges;  metoCLOPramide (REGLAN) tablet 5-10 mg, 5-10 mg, Oral, Q8H PRN, Eldred Manges metoprolol succinate (TOPROL-XL) 24 hr tablet 12.5 mg, 12.5 mg, Oral, Daily, Tobin Chad, 12.5 mg at 12/06/11 1033;  ondansetron (ZOFRAN) injection 4 mg, 4 mg, Intravenous, Q6H PRN, Eldred Manges, 4 mg at 12/04/11 1400;  ondansetron (ZOFRAN) tablet 4 mg, 4 mg, Oral, Q6H PRN, Eldred Manges;  oxyCODONE-acetaminophen (PERCOCET) 5-325 MG per tablet 1-2 tablet, 1-2 tablet, Oral, Q4H PRN, Eldred Manges simvastatin (ZOCOR) tablet 20 mg, 20 mg, Oral, Daily, Dayarmys Piloto, MD, 20 mg at 12/06/11 1033  Assessment and Plan:  1. Syncope: No events on cardiac monitoring, negative cardiac enzymes and normal EKG. Orthostatics Negative.  Asymptomatic hydrocephalus on CT/MRI. Neurology signed off, do not believe this is significant. - continue monitoring for future syncope. - f/u with family practice as new patient. Patient currently applying for medicaid.  2.. Left wrist fracture s/p open reduction and internal fixation. - Ok to dc home today per Ortho. -NV intact, pain controlled.  3. HTN: Not treated for several years.  Controlled  -Continue HCTZ and add metoprolol 12.5   4. HLD:  -Continue with statin.  5. FEN/GI:heart healthy diet  6. Prophylaxis: SCD's  7. Disposition: Home today  Makenleigh Crownover MD 12/06/2011 11:46 AM

## 2011-12-06 NOTE — Discharge Summary (Signed)
Physician Discharge Summary  Patient ID: Taylor Foley MRN: [id number] DOB/AGE: 06-03-1951 61 y.o.  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  Hospital Course:  Taylor Foley is a 61 y.o. female presenting with syncopal episode and a fall that caused left wrist fracture. Pt had episodes of dizziness in the past that had been resolved by sitting down. Also has a hx of HTN and HLD not treated for 8 years. No chest pain, palpitations, diaphoresis, around the event, or present in ROS. Had mentioned ocasional headache right sided not associated with nausea/ vomiting or vision changes. No changes on hearing. Admited to a telemetry floor with cardiac monitoring. Orthostatics were negative on admission. Cardiac enzymes were negative on admission and during hospitalization. Dr. Ophelia Charter was consulted by ED and performed ORIF on day 2 of hospitalization without complication. Patient was given adequate pain medication. She was started on HCTZ  12.5 mg and metoprolol with adequate control of BP. Ct of the head was performed in ED 2nd to syncope and fall. A dilation of the ventricles was found with diffuse cortical atrophy. Patient had no incontinence, broad based gate or altered mental status associated with NPH. Neurology was consult and they did not feel this was significant. She was monitored for over 72 hours and did not have any neurological/syncopal/vascular/cardiologic events. She was DC'd to home in stable and improved condition to follow up with Dr. Ophelia Charter in 2-3 days and with the family practice center as a new patient.   Consults: orthopedic surgery, Dr. Ophelia Charter  Significant Diagnostic Studies: radiology: X-Ray: right wrist: distal radius and ulna fracture CT head:    Diffuse atrophy. Diffuse ventricular dilatation which might  represent central atrophy although pathologic hydrocephalus is not  excluded. Consider MRI for further evaluation if symptoms persist.  Prominent cisterna magna versus arachnoid cyst in the posterior  fossa. No evidence of acute intracranial hemorrhage, acute  infarct, or mass lesion.  MRI: IMPRESSION:  1. Prominent ventricular dilation without a configuration  suggesting hydrocephalus. This could represent normal pressure  hydrocephalus. Ventricular dilation related to atrophy is favored.  2. Mild periventricular and scattered diffuse subcortical T2 and  FLAIR hyperintensity bilaterally.  3. Focal remote to infarct involving the posterior left parietal  lobe.  4. Prominent retrocerebellar CSF space likely represents a magnus  cisterna magna.  Treatments: surgery: OPEN REDUCTION INTERNAL FIXATION (ORIF) WRIST FRACTURE - right  Discharge Exam: Blood pressure 136/74, pulse 76, temperature 98.1 F (36.7 C), temperature source Oral, resp. rate 18, height 5\' 8"  (1.727 m), weight 192 lb (87.091 kg), SpO2 97.00%. General: No in acute distress.  Extremities: Left wrist with dressings. Fingers mobile, no swelling. NV intact  Neurology: no gross deficits. Conversing normally. AOx3   Disposition: Home   Medication List  As of 12/06/2011 12:01 PM   START taking these medications         hydrochlorothiazide 12.5 MG capsule   Commonly known as: MICROZIDE   Take 1 capsule (12.5 mg total) by mouth daily.      metoprolol succinate 12.5 mg Tb24   Commonly known as: TOPROL-XL   Take 1 tablet (25 mg total) by mouth daily.      oxyCODONE-acetaminophen 5-325 MG per tablet   Commonly known as: PERCOCET   Take 1-2 tablets by mouth every 4 (four) hours as  needed.      simvastatin 20 MG tablet   Commonly known as: ZOCOR   Take 2 tablets (40 mg total) by mouth daily.          Where to get your medications    These are the  prescriptions that you need to pick up.   You may get these medications from any pharmacy.         hydrochlorothiazide 12.5 MG capsule   metoprolol succinate 12.5 mg Tb24   oxyCODONE-acetaminophen 5-325 MG per tablet   simvastatin 20 MG tablet           Follow-up Information    Follow up with YATES,MARK C. Make an appointment in 2 days.   Contact information:   [facility] [address] [place] [address] [phone]       Follow up with Edd Arbour. Make an appointment in 1 day.   Contact information:   [address] [place] [address] [phone]          Signed: Edd Arbour 12/06/2011, 12:01 PM

## 2011-12-07 ENCOUNTER — Encounter (HOSPITAL_COMMUNITY): Payer: Self-pay | Admitting: Orthopaedic Surgery

## 2011-12-07 NOTE — Progress Notes (Signed)
Rec'd page from attending nurse to provide medication assistance for patient so she may be discharged. Per pharmacy, patient is eligible for the Z[employer] fund. Signed prescriptions were authorized and tubed to the pharmacy with instruction to call the nurse when ready for pick-up.

## 2011-12-07 NOTE — Progress Notes (Signed)
Family Medicine Teaching Service Attending Note  I interviewed and examined patient Taylor Foley and reviewed their tests and x-rays on 12-05-11.  I discussed with Dr. Aviva Signs and reviewed their note for today.  I agree with their assessment and plan.     Additionally  Will ask Neurology to see given her syncope and neuroimaging results

## 2012-10-19 ENCOUNTER — Emergency Department (HOSPITAL_COMMUNITY): Payer: Self-pay

## 2012-10-19 ENCOUNTER — Emergency Department (HOSPITAL_COMMUNITY)
Admission: EM | Admit: 2012-10-19 | Discharge: 2012-10-19 | Disposition: A | Discharge status: Home / Self Care | Payer: Self-pay | Attending: Emergency Medicine | Admitting: Emergency Medicine

## 2012-10-19 DIAGNOSIS — M129 Arthropathy, unspecified: Secondary | ICD-10-CM | POA: Insufficient documentation

## 2012-10-19 DIAGNOSIS — R079 Chest pain, unspecified: Secondary | ICD-10-CM | POA: Insufficient documentation

## 2012-10-19 DIAGNOSIS — E7800 Pure hypercholesterolemia, unspecified: Secondary | ICD-10-CM | POA: Insufficient documentation

## 2012-10-19 DIAGNOSIS — I10 Essential (primary) hypertension: Secondary | ICD-10-CM | POA: Insufficient documentation

## 2012-10-19 LAB — CBC
MCH: 28.4 pg (ref 26.0–34.0)
MCHC: 34.1 g/dL (ref 30.0–36.0)
MCV: 83.4 fL (ref 78.0–100.0)
Platelets: 307 10*3/uL (ref 150–400)
RDW: 13.4 % (ref 11.5–15.5)
WBC: 7.3 10*3/uL (ref 4.0–10.5)

## 2012-10-19 LAB — COMPREHENSIVE METABOLIC PANEL
ALT: 8 U/L (ref 0–35)
AST: 16 U/L (ref 0–37)
Albumin: 3.8 g/dL (ref 3.5–5.2)
Alkaline Phosphatase: 63 U/L (ref 39–117)
BUN: 16 mg/dL (ref 6–23)
Chloride: 101 mEq/L (ref 96–112)
Potassium: 4.2 mEq/L (ref 3.5–5.1)
Sodium: 137 mEq/L (ref 135–145)
Total Bilirubin: 0.2 mg/dL — ABNORMAL LOW (ref 0.3–1.2)

## 2012-10-19 NOTE — ED Provider Notes (Signed)
History     CSN: [id number]  Arrival date & time 10/19/12  1611   First MD Initiated Contact with Patient 10/19/12 1656      Chief Complaint  Patient presents with  . Chest Pain    (Consider location/radiation/quality/duration/timing/severity/associated sxs/prior treatment) Patient is a 61 y.o. female presenting with chest pain. The history is provided by the patient.  Chest Pain The chest pain began 1 - 2 hours ago. Chest pain occurs intermittently. The chest pain is unchanged. Pertinent negatives for primary symptoms include no fever, no shortness of breath, no cough, no abdominal pain, no nausea and no vomiting. Associated symptoms comments: She presents with complaint of chest pain under left breast that is sharp and brief, lasting a few seconds. No trouble breathing. No fever, cough, injury. No nausea or vomiting..     Past Medical History  Diagnosis Date  . Hypertension   . Hypercholesteremia   . Arthritis     Past Surgical History  Procedure Date  . Orif wrist fracture 12/04/2011    Procedure: OPEN REDUCTION INTERNAL FIXATION (ORIF) WRIST FRACTURE;  Surgeon: Eldred Manges;  Location: [facility] OR;  Service: Orthopedics;  Laterality: Left;    No family history on file.  History  Substance Use Topics  . Smoking status: Never Smoker   . Smokeless tobacco: Not on file  . Alcohol Use: No    OB History    Grav Para Term Preterm Abortions TAB SAB Ect Mult Living                  Review of Systems  Constitutional: Negative for fever.  Respiratory: Negative for cough and shortness of breath.   Cardiovascular: Positive for chest pain. Negative for leg swelling.  Gastrointestinal: Negative for nausea, vomiting and abdominal pain.  Musculoskeletal: Negative for back pain.    Allergies  Review of patient's allergies indicates no known allergies.  Home Medications   Current Outpatient Rx  Name  Route  Sig  Dispense  Refill  . DM-DOXYLAMINE-ACETAMINOPHEN 15-6.25-325  MG PO CAPS   Oral   Take 1 capsule by mouth at bedtime. Assured Multi Symptom Nite Time Liquid Capsules           BP 187/86  Pulse 86  Temp 98.2 F (36.8 C)  Resp 17  SpO2 99%  Physical Exam  Constitutional: She is oriented to person, place, and time. She appears well-developed and well-nourished.  HENT:  Head: Normocephalic.  Neck: Normal range of motion. Neck supple.  Cardiovascular: Normal rate and regular rhythm.   Pulmonary/Chest: Effort normal and breath sounds normal. She exhibits no tenderness.       Chest wall without discoloration or swelling.  Abdominal: Soft. Bowel sounds are normal. There is no tenderness. There is no rebound and no guarding.  Musculoskeletal: Normal range of motion. She exhibits no edema.  Neurological: She is alert and oriented to person, place, and time.  Skin: Skin is warm and dry. No rash noted.  Psychiatric: She has a normal mood and affect.    ED Course  Procedures (including critical care time)   Labs Reviewed  CBC  TROPONIN I  COMPREHENSIVE METABOLIC PANEL   Date: 10/19/2012  Rate: 73  Rhythm: normal sinus rhythm  QRS Axis: normal  Intervals: normal  ST/T Wave abnormalities: normal  Conduction Disutrbances:none  Narrative Interpretation:   Old EKG Reviewed: none available Results for orders placed during the hospital encounter of 10/19/12  TROPONIN I  Component Value Range   Troponin I <0.30  <0.30 ng/mL  COMPREHENSIVE METABOLIC PANEL      Component Value Range   Sodium 137  135 - 145 mEq/L   Potassium 4.2  3.5 - 5.1 mEq/L   Chloride 101  96 - 112 mEq/L   CO2 27  19 - 32 mEq/L   Glucose, Bld 94  70 - 99 mg/dL   BUN 16  6 - 23 mg/dL   Creatinine, Ser 1.61  0.50 - 1.10 mg/dL   Calcium 9.4  8.4 - 09.6 mg/dL   Total Protein 7.2  6.0 - 8.3 g/dL   Albumin 3.8  3.5 - 5.2 g/dL   AST 16  0 - 37 U/L   ALT 8  0 - 35 U/L   Alkaline Phosphatase 63  39 - 117 U/L   Total Bilirubin 0.2 (*) 0.3 - 1.2 mg/dL   GFR calc non Af  Amer 60 (*) >90 mL/min   GFR calc Af Amer 69 (*) >90 mL/min  CBC      Component Value Range   WBC 7.3  4.0 - 10.5 K/uL   RBC 4.93  3.87 - 5.11 MIL/uL   Hemoglobin 14.0  12.0 - 15.0 g/dL   HCT 04.5  40.9 - 81.1 %   MCV 83.4  78.0 - 100.0 fL   MCH 28.4  26.0 - 34.0 pg   MCHC 34.1  30.0 - 36.0 g/dL   RDW 91.4  78.2 - 95.6 %   Platelets 307  150 - 400 K/uL     Dg Chest 2 View  10/19/2012  *RADIOLOGY REPORT*  Clinical Data: Left-sided chest pain.  Hypertension.  Syncope.  CHEST - 2 VIEW  Comparison: 11/24/2011  Findings: Midline trachea.  Normal heart size and mediastinal contours.  Mildly tortuous descending thoracic aorta. No pleural effusion or pneumothorax.  Mild lower lobe predominant interstitial thickening is chronic. Clear lungs.  IMPRESSION:  1. No acute cardiopulmonary disease. 2.  Interstitial thickening which could relate to chronic bronchitis/smoking or asthma.   Original Report Authenticated By: Jeronimo Greaves, M.D.      No diagnosis found.   1. Nonspecific chest pain 2. Hypertension   MDM  Chest pain that is brief, sharp and without other symptoms. Normal EKG and lab studies. She has a history of HTN but on no meds for 10 years, o/w no risk factors. Doubt ACS.         Rodena Medin, PA-C 10/19/12 1800

## 2012-10-19 NOTE — ED Notes (Signed)
Pt c/o sharp, stabbing pain to L side under L breast that started about an hour ago. Pt states she was sitting when the pain came on. Pt states pain is intermittent. Pt denies pain at present. Pt denies nausea, shortness of breath. Skin warm and dry. Pain is 1/10 at present. Pt talkative in triage.

## 2012-10-19 NOTE — ED Notes (Signed)
EKG obtained and showed to Dr. Juleen China.

## 2012-10-19 NOTE — ED Provider Notes (Signed)
Medical screening examination/treatment/procedure(s) were performed by non-physician practitioner and as supervising physician I was immediately available for consultation/collaboration.  Tiare Rohlman, MD 10/19/12 2336 

## 2012-10-19 NOTE — ED Notes (Signed)
Pt states she has a history of HTN and hasn't taken her meds in at least 5 yrs.

## 2013-03-14 ENCOUNTER — Observation Stay (HOSPITAL_COMMUNITY): Payer: Self-pay

## 2013-03-14 ENCOUNTER — Encounter (HOSPITAL_COMMUNITY): Payer: Self-pay | Admitting: Emergency Medicine

## 2013-03-14 ENCOUNTER — Emergency Department (HOSPITAL_COMMUNITY): Payer: Self-pay

## 2013-03-14 ENCOUNTER — Observation Stay (HOSPITAL_COMMUNITY)
Admission: EM | Admit: 2013-03-14 | Discharge: 2013-03-16 | Disposition: A | Discharge status: Home / Self Care | Payer: 59 | Attending: Internal Medicine | Admitting: Internal Medicine

## 2013-03-14 DIAGNOSIS — S50312A Abrasion of left elbow, initial encounter: Secondary | ICD-10-CM

## 2013-03-14 DIAGNOSIS — I10 Essential (primary) hypertension: Secondary | ICD-10-CM | POA: Diagnosis present

## 2013-03-14 DIAGNOSIS — G319 Degenerative disease of nervous system, unspecified: Secondary | ICD-10-CM | POA: Insufficient documentation

## 2013-03-14 DIAGNOSIS — R55 Syncope and collapse: Principal | ICD-10-CM | POA: Diagnosis present

## 2013-03-14 DIAGNOSIS — G44209 Tension-type headache, unspecified, not intractable: Secondary | ICD-10-CM | POA: Insufficient documentation

## 2013-03-14 DIAGNOSIS — Z9181 History of falling: Secondary | ICD-10-CM | POA: Insufficient documentation

## 2013-03-14 DIAGNOSIS — R42 Dizziness and giddiness: Secondary | ICD-10-CM | POA: Insufficient documentation

## 2013-03-14 HISTORY — DX: Syncope and collapse: R55

## 2013-03-14 HISTORY — DX: Hematuria, unspecified: R31.9

## 2013-03-14 HISTORY — DX: Shortness of breath: R06.02

## 2013-03-14 LAB — URINALYSIS, ROUTINE W REFLEX MICROSCOPIC
Bilirubin Urine: NEGATIVE
Ketones, ur: NEGATIVE mg/dL
Nitrite: NEGATIVE
Urobilinogen, UA: 0.2 mg/dL (ref 0.0–1.0)

## 2013-03-14 LAB — CBC WITH DIFFERENTIAL/PLATELET
Basophils Absolute: 0 10*3/uL (ref 0.0–0.1)
Eosinophils Relative: 1 % (ref 0–5)
HCT: 38.1 % (ref 36.0–46.0)
Lymphocytes Relative: 21 % (ref 12–46)
Lymphs Abs: 1.4 10*3/uL (ref 0.7–4.0)
MCV: 82.5 fL (ref 78.0–100.0)
Neutro Abs: 4.8 10*3/uL (ref 1.7–7.7)
Platelets: 234 10*3/uL (ref 150–400)
RBC: 4.62 MIL/uL (ref 3.87–5.11)
RDW: 13.6 % (ref 11.5–15.5)
WBC: 6.6 10*3/uL (ref 4.0–10.5)

## 2013-03-14 LAB — RAPID URINE DRUG SCREEN, HOSP PERFORMED
Amphetamines: NOT DETECTED
Benzodiazepines: NOT DETECTED
Cocaine: NOT DETECTED

## 2013-03-14 LAB — BASIC METABOLIC PANEL
CO2: 27 mEq/L (ref 19–32)
Calcium: 8.7 mg/dL (ref 8.4–10.5)
Chloride: 100 mEq/L (ref 96–112)
GFR calc Af Amer: 70 mL/min — ABNORMAL LOW (ref >90)
Sodium: 134 mEq/L — ABNORMAL LOW (ref 135–145)

## 2013-03-14 LAB — ETHANOL: Alcohol, Ethyl (B): <11 mg/dL (ref 0–11)

## 2013-03-14 LAB — TROPONIN I: Troponin I: <0.3 ng/mL (ref <0.30)

## 2013-03-14 MED ORDER — ENOXAPARIN SODIUM 40 MG/0.4ML ~~LOC~~ SOLN
40.0000 mg | SUBCUTANEOUS | Status: DC
  Filled 2013-03-14 (×3): qty 0.4

## 2013-03-14 MED ORDER — ACETAMINOPHEN 325 MG PO TABS: 650.0000 mg | ORAL_TABLET | Freq: Four times a day (QID) | ORAL | Status: DC | PRN

## 2013-03-14 MED ORDER — DOCUSATE SODIUM 100 MG PO CAPS
100.0000 mg | ORAL_CAPSULE | Freq: Two times a day (BID) | ORAL | Status: DC | PRN
  Filled 2013-03-14: qty 1

## 2013-03-14 MED ORDER — ACETAMINOPHEN 650 MG RE SUPP: 650.0000 mg | Freq: Four times a day (QID) | RECTAL | Status: DC | PRN

## 2013-03-14 MED ORDER — AMLODIPINE BESYLATE 5 MG PO TABS
5.0000 mg | ORAL_TABLET | Freq: Every day | ORAL | Status: DC
  Filled 2013-03-14: qty 1

## 2013-03-14 MED ORDER — SODIUM CHLORIDE 0.9 % IV SOLN
INTRAVENOUS | Status: DC
  Administered 2013-03-14 (×2): via INTRAVENOUS

## 2013-03-14 MED ORDER — SODIUM CHLORIDE 0.9 % IV SOLN
INTRAVENOUS | Status: AC
  Administered 2013-03-14: 19:00:00 via INTRAVENOUS

## 2013-03-14 MED ORDER — ONDANSETRON HCL 4 MG PO TABS: 4.0000 mg | ORAL_TABLET | Freq: Four times a day (QID) | ORAL | Status: DC | PRN

## 2013-03-14 MED ORDER — TETANUS-DIPHTH-ACELL PERTUSSIS 5-2.5-18.5 LF-MCG/0.5 IM SUSP
0.5000 mL | Freq: Once | INTRAMUSCULAR | Status: AC
  Administered 2013-03-14: 0.5 mL via INTRAMUSCULAR
  Filled 2013-03-14: qty 0.5

## 2013-03-14 MED ORDER — ONDANSETRON HCL 4 MG/2ML IJ SOLN: 4.0000 mg | Freq: Four times a day (QID) | INTRAMUSCULAR | Status: DC | PRN

## 2013-03-14 MED ORDER — SODIUM CHLORIDE 0.9 % IJ SOLN
3.0000 mL | Freq: Two times a day (BID) | INTRAMUSCULAR | Status: DC
  Administered 2013-03-15: 3 mL via INTRAVENOUS

## 2013-03-14 NOTE — H&P (Signed)
Hospital Admission Note Date: 03/14/2013  Patient name: Taylor Foley Medical record number: [id number] Date of birth: 04/17/1951 Age: 62 y.o. Gender: female PCP: No PCP  Medical Service: Internal Medicine Teaching Service  Attending physician: Dr. Kem Kays    1st Contact: Suzie Portela, MS4  Pager: [phone] 2nd Contact: Janalyn Harder   Pager: [phone] After 5 pm or weekends: 1st Contact:      Pager: [phone] 2nd Contact:      Pager: [phone]  Chief Complaint: Syncope  History of Present Illness: The patient is a 62 yo W, history of HTN, HL, 1 prior syncopal episode, presenting with syncope.  The patient was walking outside from her house, when she had an unwitnessed fall with LOC.  Her sister notes that EMS found her on the ground, lying on her left side.  When aroused, the patient was disoriented for a few minutes, then realized where she was.  She notes no preceding dizziness, palpitations, chest pain, SOB, visual/auditory/gustatory aura, or headache.  She notes normal PO intake lately.  Bystanders report no limb jerking movements, and she notes no incontinence of urine or stool.  The patient had a similar episode 11/2011 of syncope with no surrounding symptoms, resulting in wrist fracture (s/p ORIF), and work-up at that time was essentially negative.  MRI showed possible hydrocephalus, but neurology consult at that time did not believe this was the cause of the patient's symptoms.  The patient notes symptoms of occasional "dizziness" over the last 5 years, which occur once/week with no particular pattern.  She has not noticed this to happen particularly often when standing from a sitting position, or with turning her head.  She did not feel dizzy prior to today's episode of syncope.  She also notes "walking like I'm drunk" sometimes, noting feeling imbalanced with an erratic gait, though these symptoms can occur independently of her dizziness.  She notes urinary frequency at baseline, but no  urinary incontinence.  The patient notes that she is currently taking no medications.  Meds: No current outpatient prescriptions on file.  Allergies: Allergies as of 03/14/2013  . (No Known Allergies)   Past Medical History  Diagnosis Date  . Hypertension   . Hypercholesteremia   . Arthritis   . Syncopal episodes    Past Surgical History  Procedure Laterality Date  . Orif wrist fracture  12/04/2011    Procedure: OPEN REDUCTION INTERNAL FIXATION (ORIF) WRIST FRACTURE;  Surgeon: Eldred Manges;  Location: [facility] OR;  Service: Orthopedics;  Laterality: Left;   Family History  Problem Relation Age of Onset  . Congestive Heart Failure Father   . Hypertension Father   . Hyperlipidemia Father   . Diabetes Other   . Heart disease Other   . Prostate cancer Father   . Ovarian cancer Mother   . Hypertension Mother    History   Social History  . Marital Status: Single    Spouse Name: N/A    Number of Children: N/A  . Years of Education: N/A   Occupational History  . Not on file.   Social History Main Topics  . Smoking status: Never Smoker   . Smokeless tobacco: Not on file  . Alcohol Use: No  . Drug Use: No  . Sexually Active: Not on file   Other Topics Concern  . Not on file   Social History Narrative  . No narrative on file    Review of Systems: ROS General: no fevers, chills, changes in weight, changes in appetite  Skin: no rash HEENT: no hearing changes, sore throat Pulm: no dyspnea, coughing, wheezing CV: no chest pain, palpitations, shortness of breath Abd: no abdominal pain, nausea/vomiting, diarrhea/constipation GU: no dysuria, hematuria, polyuria Ext: no arthralgias, myalgias Neuro: no weakness, numbness, or tingling  Physical Exam: Blood pressure 162/93, pulse 75, temperature 98.1 F (36.7 C), temperature source Oral, resp. rate 18, height 5\' 8"  (1.727 m), weight 216 lb 1.6 oz (98.022 kg), SpO2 100.00%. General: alert, cooperative, and in no apparent  distress HEENT: pupils equal round and reactive to light, vision grossly intact, oropharynx clear and non-erythematous  Neck: supple, no lymphadenopathy  Lungs: clear to ascultation bilaterally, normal work of respiration, no wheezes, rales, ronchi Heart: regular rate and rhythm, no murmurs, gallops, or rubs Abdomen: soft, non-tender, non-distended, normal bowel sounds Msk: no joint edema, warmth, or erythema Extremities: no cyanosis, clubbing, or edema Neurologic: alert & oriented X3, cranial nerves II-XII intact, strength 5/5 throughout, reflexes 1+ throughout, intact finger-to-nose, regular gait normal, heel-to-toe gait impaired, romberg negative. Dix-Halpike negative   Lab results: Basic Metabolic Panel:  Recent Labs  16/10/96 1520  NA 134*  K 4.0  CL 100  CO2 27  GLUCOSE 134*  BUN 17  CREATININE 0.98  CALCIUM 8.7   CBC:  Recent Labs  03/14/13 1520  WBC 6.6  NEUTROABS 4.8  HGB 13.1  HCT 38.1  MCV 82.5  PLT 234   Cardiac Enzymes:  Recent Labs  03/14/13 1520  TROPONINI <0.30   Urinalysis:  Recent Labs  03/14/13 1618  COLORURINE YELLOW  LABSPEC 1.014  PHURINE 7.0  GLUCOSEU NEGATIVE  HGBUR TRACE*  BILIRUBINUR NEGATIVE  KETONESUR NEGATIVE  PROTEINUR NEGATIVE  UROBILINOGEN 0.2  NITRITE NEGATIVE  LEUKOCYTESUR NEGATIVE    Imaging results:  Dg Chest 2 View  03/14/2013  *RADIOLOGY REPORT*  Clinical Data: Syncope  CHEST - 2 VIEW  Comparison: 10/19/2012  Findings: Cardiac leads obscure detail.  Heart size upper limits of normal.  The lungs are clear.  No pleural effusion.  No acute osseous finding.  IMPRESSION: Borderline cardiomegaly.  No focal acute abnormality.   Original Report Authenticated By: Christiana Pellant, M.D.    Dg Elbow Complete Left  03/14/2013  *RADIOLOGY REPORT*  Clinical Data: Fall, left elbow pain, posterior soft tissue laceration over the elbow  LEFT ELBOW - COMPLETE 3+ VIEW  Comparison: No similar prior study is available for comparison.   Findings: No fracture or dislocation.  No soft tissue abnormality. No radiopaque foreign body.  IMPRESSION: No acute osseous abnormality of the left elbow.   Original Report Authenticated By: Christiana Pellant, M.D.    Dg Wrist Complete Left  03/14/2013  *RADIOLOGY REPORT*  Clinical Data: Left wrist pain, prior surgery  LEFT WRIST - COMPLETE 3+ VIEW  Comparison: Prior radiographs of the left hand 12/02/2011  Findings: ORIF of a healed distal radius fracture with volar plate and screw construct.  No evidence of hardware complication or periprosthetic fracture.  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  The bones appear mildly osteopenic. Degenerative changes noted at the thumb carpometacarpal joint. There may be very mild widening of the scaphoid lunate interspace.  IMPRESSION:  1.  Query mild widening of the scaphoid lunate interspace as can be seen with injury of the scapholunate ligament. 2. Healed distal radius fracture status post volar plate ORIF. 3.  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   Original Report Authenticated By: Malachy Moan, M.D.  Ct Head Wo Contrast  03/14/2013  *RADIOLOGY REPORT*  Clinical Data: Loss of consciousness.  CT HEAD WITHOUT CONTRAST  Technique:  Contiguous axial images were obtained from the base of the skull through the vertex without contrast.  Comparison: MRI of 12/04/2011 and CT of 12/02/2011  Findings: Bone windows demonstrate no significant soft tissue swelling. Clear paranasal sinuses and mastoid air cells.  Soft tissue windows demonstrate mildly age advanced cerebral atrophy.  This is unchanged.  Moderate low density in the periventricular white matter likely related to small vessel disease.This is increased since the prior.  Mega cisterna magna, a normal anatomic variant.  Global ventriculomegaly is unchanged since the prior.  No hemorrhage, mass lesion, cortically based acute  infarct, intra- axial, or extra-axial fluid collection.  IMPRESSION: 1. No acute intracranial abnormality. 2.  Similar cerebral atrophy with progressive small vessel ischemic change. 3.  Similar global ventriculomegaly.  Although this could relate to underlying cerebral atrophy, normal pressure hydrocephalous could look similar.  Consider clinical exclusion.   Original Report Authenticated By: Jeronimo Greaves, M.D.     Other results: EKG: NSR, no ST abnormalities  Assessment & Plan by Problem: The patient is a 62 yo woman, history of HTN, presenting with recurrent syncope.  # Syncope - The patient presents after an episode of syncope.  Differential includes seizure (post-ictal confusion, but no tongue biting or loss of bowel/bladder continence) vs arrythmia (though no palpitations, echo 2012 unremarkable) vs orthostatic hypotension vs hypoglycemia vs metabolic abnormality.  CT head showed no abnormality. -admit to telemetry -check orthostatics -check vit B12, TSH, RPR, A1C, UDS, ethanol level, CK -consider holter monitor if above work-up negative -carotid dopplers  # Dizziness - patient notes a 5-year history of dizziness, independent of syncope.  Differential includes BPPV (though dix-halpike neg) vs ?orthostatic hypotension vs vestibular neuronitis (though no preceding viral illness). -check orthostatics -see syncope work-up above  # Hypertension - history of HTN, currently not on any anti-hypertensive medications. -start amlodipine  # Prophy - lovenox  Dispo: Disposition is deferred at this time, awaiting improvement of current medical problems. Anticipated discharge in approximately 1-2 day(s).   The patient does not have a current PCP (Provider Not In System), therefore will not be requiring [facility] follow-up after discharge.   SignedJanalyn Harder 03/14/2013, 6:55 PM

## 2013-03-14 NOTE — H&P (Signed)
[facility] Admission Note Date: 03/14/2013  Patient name: Taylor Foley Medical record number: [id number] Date of birth: [birth year] Age: 62 y.o. Gender: female PCP: Provider Not In System, None  Medical Service:  Internal Medicine Teaching Service  Attending physician:    Thomos Lemons, DO     Chief Complaint:   Syncopal episode  History of Present Illness:  Taylor Foley is a 62yo F with hx notable for HTN, HLD, arthritis and previous syncopal episodes who presents today after syncopal episode and being found on the ground unconscious.  Patient reports that she was walking and feeling in her usual state of health and the next thing she recalls is being in the ambulance with EMS.  She has had at least 4 of these episodes that she can recall, with one resulting in hospital admission in 2012 when she fell and broke her wrist.  She says she has routinely experiences dizziness-lightheadnedness and has had that for at least 3-6 years.  Per her sister who is in the room with her, EMS knocked on her door today shortly after this episode and she found her sister laying on the ground on her left side.  Taylor Foley was disoriented and not aware of her surroundings and did not return to her baseline mental status for 30-45 minutes.  She was not found to be incontinent of urine nor stool nor did she notice any unusual jerking movements or tongue biting.  She has been disoriented after her previous fainting episodes as well.  Taylor Foley denies any pain aside from soreness and bruising on her left side where she thinks she landed when she fainted.  She did endorse a sensation of lightheadedness when EMS stood her up from the ground.  She also endorses a mild headache around the top of her head.  She denies any sensations of palpitations, chest pain.  She notes some recent increased urinary frequency and some orthopnea.  She denies any recent illness, fevers, or sick contacts.  She denies any recent  alcohol or drug use or changes in her medications.  She denies any sensations that the room is spinning.  Meds: No current outpatient prescriptions on file.  Allergies: Allergies as of 03/14/2013  . (No Known Allergies)   Past Medical History  Diagnosis Date  . Hypertension   . Hypercholesteremia   . Arthritis   . Syncopal episodes    Past Surgical History  Procedure Laterality Date  . Orif wrist fracture  12/04/2011    Procedure: OPEN REDUCTION INTERNAL FIXATION (ORIF) WRIST FRACTURE;  Surgeon: Eldred Manges;  Location: [facility] OR;  Service: Orthopedics;  Laterality: Left;   No family history on file. History   Social History  . Marital Status: Single    Spouse Name: N/A    Number of Children: N/A  . Years of Education: N/A   Occupational History  . Not on file.   Social History Main Topics  . Smoking status: Never Smoker   . Smokeless tobacco: Not on file  . Alcohol Use: No  . Drug Use: No  . Sexually Active: Not on file   Other Topics Concern  . Not on file   Social History Narrative  . No narrative on file    Review of Systems: Pertinent items are noted in HPI.  Physical Exam: Blood pressure 172/84, pulse 87, temperature 97.4 F (36.3 C), temperature source Oral, resp. rate 17, height 5\' 8"  (1.727 m), weight 90.719 kg (200 lb), SpO2 99.00%. BP  172/84  Pulse 87  Temp(Src) 97.4 F (36.3 C) (Oral)  Resp 17  Ht 5\' 8"  (1.727 m)  Wt 90.719 kg (200 lb)  BMI 30.42 kg/m2  SpO2 99%  General Appearance:    Alert, cooperative, no distress, appears stated age  Head:    Normocephalic, without obvious abnormality, atraumatic  Eyes:    PERRL, conjunctiva/corneas clear, EOM's intact, fundi    benign, both eyes  Neck:   Supple, symmetrical, trachea midline, no adenopathy;    thyroid:  no enlargement/tenderness/nodules; no carotid   bruit or JVD  Back:     Symmetric, no curvature, ROM normal, no CVA tenderness  Lungs:     Clear to auscultation bilaterally,  respirations unlabored   Heart:    Regular rate and rhythm, S1 and S2 normal, no murmur, rub   or gallop  Abdomen:     Soft, non-tender, bowel sounds active all four quadrants,    no masses, no organomegaly  Extremities:   Extremities normal, laceration noted over left elbow, no cyanosis or edema  Pulses:   2+ and symmetric all extremities  Neurologic:   CNII-XII intact, normal strength, sensation and reflexes    Throughout, Dix-Hallpike maneuver performed and was negative.  Mildl;y antalgic gait, mildly unbalanced tandem walking, heel walking, and toe walking.  Point-to-point testing normal.  Negative romberg.     Lab results: Results for orders placed during the hospital encounter of 03/14/13 (from the past 24 hour(s))  BASIC METABOLIC PANEL     Status: Abnormal   Collection Time    03/14/13  3:20 PM      Result Value Range   Sodium 134 (*) 135 - 145 mEq/L   Potassium 4.0  3.5 - 5.1 mEq/L   Chloride 100  96 - 112 mEq/L   CO2 27  19 - 32 mEq/L   Glucose, Bld 134 (*) 70 - 99 mg/dL   BUN 17  6 - 23 mg/dL   Creatinine, Ser 1.61  0.50 - 1.10 mg/dL   Calcium 8.7  8.4 - 09.6 mg/dL   GFR calc non Af Amer 61 (*) >90 mL/min   GFR calc Af Amer 70 (*) >90 mL/min  CBC WITH DIFFERENTIAL     Status: None   Collection Time    03/14/13  3:20 PM      Result Value Range   WBC 6.6  4.0 - 10.5 K/uL   RBC 4.62  3.87 - 5.11 MIL/uL   Hemoglobin 13.1  12.0 - 15.0 g/dL   HCT 04.5  40.9 - 81.1 %   MCV 82.5  78.0 - 100.0 fL   MCH 28.4  26.0 - 34.0 pg   MCHC 34.4  30.0 - 36.0 g/dL   RDW 91.4  78.2 - 95.6 %   Platelets 234  150 - 400 K/uL   Neutrophils Relative 73  43 - 77 %   Neutro Abs 4.8  1.7 - 7.7 K/uL   Lymphocytes Relative 21  12 - 46 %   Lymphs Abs 1.4  0.7 - 4.0 K/uL   Monocytes Relative 5  3 - 12 %   Monocytes Absolute 0.4  0.1 - 1.0 K/uL   Eosinophils Relative 1  0 - 5 %   Eosinophils Absolute 0.0  0.0 - 0.7 K/uL   Basophils Relative 1  0 - 1 %   Basophils Absolute 0.0  0.0 - 0.1 K/uL   TROPONIN I     Status: None   Collection Time  03/14/13  3:20 PM      Result Value Range   Troponin I <0.30  <0.30 ng/mL  URINALYSIS, ROUTINE W REFLEX MICROSCOPIC     Status: Abnormal   Collection Time    03/14/13  4:18 PM      Result Value Range   Color, Urine YELLOW  YELLOW   APPearance CLEAR  CLEAR   Specific Gravity, Urine 1.014  1.005 - 1.030   pH 7.0  5.0 - 8.0   Glucose, UA NEGATIVE  NEGATIVE mg/dL   Hgb urine dipstick TRACE (*) NEGATIVE   Bilirubin Urine NEGATIVE  NEGATIVE   Ketones, ur NEGATIVE  NEGATIVE mg/dL   Protein, ur NEGATIVE  NEGATIVE mg/dL   Urobilinogen, UA 0.2  0.0 - 1.0 mg/dL   Nitrite NEGATIVE  NEGATIVE   Leukocytes, UA NEGATIVE  NEGATIVE  URINE MICROSCOPIC-ADD ON     Status: None   Collection Time    03/14/13  4:18 PM      Result Value Range   RBC / HPF 0-2  <3 RBC/hpf   Bacteria, UA RARE  RARE     Imaging results:  Dg Chest 2 View  03/14/2013  *RADIOLOGY REPORT*  Clinical Data: Syncope  CHEST - 2 VIEW  Comparison: 10/19/2012  Findings: Cardiac leads obscure detail.  Heart size upper limits of normal.  The lungs are clear.  No pleural effusion.  No acute osseous finding.  IMPRESSION: Borderline cardiomegaly.  No focal acute abnormality.   Original Report Authenticated By: Christiana Pellant, M.D.    Dg Elbow Complete Left  03/14/2013  *RADIOLOGY REPORT*  Clinical Data: Fall, left elbow pain, posterior soft tissue laceration over the elbow  LEFT ELBOW - COMPLETE 3+ VIEW  Comparison: No similar prior study is available for comparison.  Findings: No fracture or dislocation.  No soft tissue abnormality. No radiopaque foreign body.  IMPRESSION: No acute osseous abnormality of the left elbow.   Original Report Authenticated By: Christiana Pellant, M.D.    Dg Wrist Complete Left  03/14/2013  *RADIOLOGY REPORT*  Clinical Data: Left wrist pain, prior surgery  LEFT WRIST - COMPLETE 3+ VIEW  Comparison: Prior radiographs of the left hand 12/02/2011  Findings: ORIF of a  healed distal radius fracture with volar plate and screw construct.  No evidence of hardware complication or periprosthetic fracture.  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  The bones appear mildly osteopenic. Degenerative changes noted at the thumb carpometacarpal joint. There may be very mild widening of the scaphoid lunate interspace.  IMPRESSION:  1.  Query mild widening of the scaphoid lunate interspace as can be seen with injury of the scapholunate ligament. 2. Healed distal radius fracture status post volar plate ORIF. 3.  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   Original Report Authenticated By: Malachy Moan, M.D.    Ct Head Wo Contrast  03/14/2013  *RADIOLOGY REPORT*  Clinical Data: Loss of consciousness.  CT HEAD WITHOUT CONTRAST  Technique:  Contiguous axial images were obtained from the base of the skull through the vertex without contrast.  Comparison: MRI of 12/04/2011 and CT of 12/02/2011  Findings: Bone windows demonstrate no significant soft tissue swelling. Clear paranasal sinuses and mastoid air cells.  Soft tissue windows demonstrate mildly age advanced cerebral atrophy.  This is unchanged.  Moderate low density in the periventricular white matter likely related to small vessel disease.This is increased since the prior.  Mega cisterna magna,  a normal anatomic variant.  Global ventriculomegaly is unchanged since the prior.  No hemorrhage, mass lesion, cortically based acute infarct, intra- axial, or extra-axial fluid collection.  IMPRESSION: 1. No acute intracranial abnormality. 2.  Similar cerebral atrophy with progressive small vessel ischemic change. 3.  Similar global ventriculomegaly.  Although this could relate to underlying cerebral atrophy, normal pressure hydrocephalous could look similar.  Consider clinical exclusion.   Original Report Authenticated By: Jeronimo Greaves, M.D.     Other  results: EKG: normal EKG, normal sinus rhythm, unchanged from previous tracings.  Assessment & Plan by Problem: Ms. Hoadley is a 62yo F presenting status post syncopal episode with a history of same, currently in stable, asymptomatic condition.  #  Syncope - Unclear etiology, differential includes cardiac insufficiency, arrhythmia, seizures, vasovagal syncope, orthostatic hypotension, TIA, CVA, hypoglycemic episode, toxic causes.  Previous workup includes normal echocardiogram and patient has normal, unchanged EKG as well today, lowering the likelihood of a cardiac etiology.  Though patient was noted to be disoriented following her episode, but did not display any other sequelae of an ictal episode including urinary/stool incontinence or tonic-clonic movements.  I do not feel that these episodes are due to vertigo given both negative Dix-Hallpike testing and that the patient denies sensation of spinning.  Patient did not have low blood glucose on her admission labs. CT imaging today did not reveal any acute intracranial processes.  Previous MRI imaging was suggestive of normal pressure hydrocephalus but neurology evaluation did not feel this was likely and patient did not note any worsening gait, memory loss, or urinary incontinence.  Given previous negative workup, feel this is likely idiopathic but will pursue workup to rule out structural, infectious, or other causes.  - Admit to telemetry with cardiac monitoring - Orthostatic vitals on admission -RPR, B12, HbA1c -f/u EtOH levels -f/u UDS - Carotid doppler - consider tilt table testing on outpatient basis  # Fall - Patient had a laceration on her left elbow and notes some soreness on her left side where she thinks she landed.  Imaging of wrist and elbow showed no osseous abnormalities.  Will provide pain medication as needed for symptom management.  - CK to evaluate for rhabdomyolysis given fall - Tylenol prn  #  Hypertension - Patient has  elevated BP's in the 150/80's to 170/90's and has not been on a hypertensive regimen.  Will start amlodipine during this hospital stay. - amlodipine 5mg  po daily  # Hyerlipidemia - Patient has reported history of HLD, not currently on medication regimen.  We will defer management of this problem to an outpatient setting  # FEN - Regular diet  # PPx - enoxoparin [place]  # Dispo - Anticipate discharge to home pending evaluation of her syncopal episode.  Anticipated discharge in 1-2 days.  Patient does not have a PCP and will need follow-up in [facility]  Patient does not have any transportation limitations preventing her from getting to follow-up appointments.    This is a Psychologist, occupational Note.  The care of the patient was discussed with Dr. Manson Passey and the assessment and plan was formulated with their assistance.  Please see their note for official documentation of the patient encounter.   SignedNanci Pina 03/14/2013, 5:52 PM

## 2013-03-14 NOTE — ED Notes (Addendum)
Per EMS, patient was found by bystander on the ground.  Patient had been midday exercise walk.  Patient states "passed out".  EMS advised patient asked a lot of repetitive questions.  Patient could not tell them what year or what day it was.  EMS advised that she is vitally stable.  18 G  Left hand.  Sister on the way to the hospital.  EMS further advises that sister of patient told them that patient has had 4 of these episodes and always leaves the hospital before finding out what the problem is.

## 2013-03-14 NOTE — ED Provider Notes (Signed)
History     CSN: [id number]  Arrival date & time 03/14/13  1331   First MD Initiated Contact with Patient 03/14/13 1333      Chief Complaint  Patient presents with  . Loss of Consciousness     HPI Pt was seen at 1330.  Per EMS, bystander, pt's family and pt report, pt with sudden onset and resolution of one episode of syncope that occurred PTA.  Bystanders stated the "found her on the ground" unresponsive.  Pt was awake with repetitive questioning on EMS arrival. Pt states she remembers "sitting there and next thing the EMS people were there."  Pt's sister states pt has hx of syncopal episodes, but has left AMA before "finding out what happened."  Pt denies prodromal symptoms before syncope. Denies CP/palpiations, no SOB/cough, no abd pain, no N/V/D, no focal motor weakness, no tingling/numbness in extremities.    Past Medical History  Diagnosis Date  . Hypertension   . Hypercholesteremia   . Arthritis   . Syncopal episodes     Past Surgical History  Procedure Laterality Date  . Orif wrist fracture  12/04/2011    Procedure: OPEN REDUCTION INTERNAL FIXATION (ORIF) WRIST FRACTURE;  Surgeon: Eldred Manges;  Location: [facility] OR;  Service: Orthopedics;  Laterality: Left;     History  Substance Use Topics  . Smoking status: Never Smoker   . Smokeless tobacco: Not on file  . Alcohol Use: No    Review of Systems ROS: Statement: All systems negative except as marked or noted in the HPI; Constitutional: Negative for fever and chills. ; ; Eyes: Negative for eye pain, redness and discharge. ; ; ENMT: Negative for ear pain, hoarseness, nasal congestion, sinus pressure and sore throat. ; ; Cardiovascular: Negative for chest pain, palpitations, diaphoresis, dyspnea and peripheral edema. ; ; Respiratory: Negative for cough, wheezing and stridor. ; ; Gastrointestinal: Negative for nausea, vomiting, diarrhea, abdominal pain, blood in stool, hematemesis, jaundice and rectal bleeding. . ; ;  Genitourinary: Negative for dysuria, flank pain and hematuria. ; ; Musculoskeletal: Negative for back pain and neck pain. Negative for swelling and trauma.; ; Skin: +abrasion. Negative for pruritus, rash, blisters, bruising and skin lesion.; ; Neuro: Negative for headache, lightheadedness and neck stiffness. Negative for weakness, altered level of consciousness , altered mental status, extremity weakness, paresthesias, involuntary movement, seizure and +syncope.       Allergies  Review of patient's allergies indicates no known allergies.  Home Medications  No current outpatient prescriptions on file.  BP 171/106  Pulse 88  Temp(Src) 97.4 F (36.3 C) (Oral)  Resp 24  Ht 5\' 8"  (1.727 m)  Wt 200 lb (90.719 kg)  BMI 30.42 kg/m2  SpO2 96%  Physical Exam 1335: Physical examination:  Nursing notes reviewed; Vital signs and O2 SAT reviewed;  Constitutional: Well developed, Well nourished, Well hydrated, In no acute distress; Head:  Normocephalic, atraumatic; Eyes: EOMI, PERRL, No scleral icterus; ENMT: Mouth and pharynx normal, Mucous membranes moist; Neck: Supple, Full range of motion, No lymphadenopathy; Cardiovascular: Regular rate and rhythm, No gallop; Respiratory: Breath sounds clear & equal bilaterally, No rales, rhonchi, wheezes.  Speaking full sentences with ease, Normal respiratory effort/excursion; Chest: Nontender, Movement normal; Abdomen: Soft, Nontender, Nondistended, Normal bowel sounds; Genitourinary: No CVA tenderness; Spine:  No midline CS, TS, LS tenderness.;; Extremities: Pulses normal, No tenderness, No deformity. No edema, No calf edema or asymmetry.; Neuro: AA&Ox3, vague historian. Major CN grossly intact.  Strength 5/5 equal bilat UE's and LE's.  DTR 2/4 equal bilat UE's and LE's.  No gross sensory deficits.  Normal cerebellar testing bilat UE's (finger-nose) and LE's (heel-shin). Gait steady. Speech clear.  No facial droop.  No nystagmus..; Skin: Color normal, Warm, Dry,  +hemostatic superficial abrasion to left elbow, NT left elbow/shoulder/wrist/hand with FROM LUE without pain.   ED Course  Procedures  1340:  Pt informed by myself and ED RN after our H&P that she would be admitted to the hospital once her ED workup was completed.  Pt verb understanding and is agreeable. Unk last Td; will update. Local wound care provided to elbow abrasion.   1630:  Pt continues to agree to stay for admission.  Pt now requesting to "have an XR of my left wrist" because "it's looked funny since my surgery" in 11/2011. No deformity left wrist, pt with FROM without tenderness. Will check XR today.  Dx and testing d/w pt and family.  Questions answered.  Verb understanding, agreeable to admit. T/C to [facility] Resident , case discussed, including:  HPI, pertinent PM/SHx, VS/PE, dx testing, ED course and treatment:  Agreeable to admit, requests they will come to ED for eval.     MDM  MDM Reviewed: previous chart, nursing note and vitals Reviewed previous: ECG Interpretation: ECG, x-ray and CT scan      Date: 03/14/2013  Rate: 71  Rhythm: normal sinus rhythm  QRS Axis: normal  Intervals: normal  ST/T Wave abnormalities: normal  Conduction Disutrbances:none  Narrative Interpretation:   Old EKG Reviewed: unchanged; no significant changes from previous EKG dated 10/19/2012.  Dg Chest 2 View 03/14/2013  *RADIOLOGY REPORT*  Clinical Data: Syncope  CHEST - 2 VIEW  Comparison: 10/19/2012  Findings: Cardiac leads obscure detail.  Heart size upper limits of normal.  The lungs are clear.  No pleural effusion.  No acute osseous finding.  IMPRESSION: Borderline cardiomegaly.  No focal acute abnormality.   Original Report Authenticated By: Christiana Pellant, M.D.    Dg Elbow Complete Left 03/14/2013  *RADIOLOGY REPORT*  Clinical Data: Fall, left elbow pain, posterior soft tissue laceration over the elbow  LEFT ELBOW - COMPLETE 3+ VIEW  Comparison: No similar prior study is available for  comparison.  Findings: No fracture or dislocation.  No soft tissue abnormality. No radiopaque foreign body.  IMPRESSION: No acute osseous abnormality of the left elbow.   Original Report Authenticated By: Christiana Pellant, M.D.    Ct Head Wo Contrast 03/14/2013  *RADIOLOGY REPORT*  Clinical Data: Loss of consciousness.  CT HEAD WITHOUT CONTRAST  Technique:  Contiguous axial images were obtained from the base of the skull through the vertex without contrast.  Comparison: MRI of 12/04/2011 and CT of 12/02/2011  Findings: Bone windows demonstrate no significant soft tissue swelling. Clear paranasal sinuses and mastoid air cells.  Soft tissue windows demonstrate mildly age advanced cerebral atrophy.  This is unchanged.  Moderate low density in the periventricular white matter likely related to small vessel disease.This is increased since the prior.  Mega cisterna magna, a normal anatomic variant.  Global ventriculomegaly is unchanged since the prior.  No hemorrhage, mass lesion, cortically based acute infarct, intra- axial, or extra-axial fluid collection.  IMPRESSION: 1. No acute intracranial abnormality. 2.  Similar cerebral atrophy with progressive small vessel ischemic change. 3.  Similar global ventriculomegaly.  Although this could relate to underlying cerebral atrophy, normal pressure hydrocephalous could look similar.  Consider clinical exclusion.   Original Report Authenticated By: Jeronimo Greaves, M.D.     Results  for orders placed during the hospital encounter of 03/14/13  BASIC METABOLIC PANEL      Result Value Range   Sodium 134 (*) 135 - 145 mEq/L   Potassium 4.0  3.5 - 5.1 mEq/L   Chloride 100  96 - 112 mEq/L   CO2 27  19 - 32 mEq/L   Glucose, Bld 134 (*) 70 - 99 mg/dL   BUN 17  6 - 23 mg/dL   Creatinine, Ser 7.82  0.50 - 1.10 mg/dL   Calcium 8.7  8.4 - 95.6 mg/dL   GFR calc non Af Amer 61 (*) >90 mL/min   GFR calc Af Amer 70 (*) >90 mL/min  CBC WITH DIFFERENTIAL      Result Value Range    WBC 6.6  4.0 - 10.5 K/uL   RBC 4.62  3.87 - 5.11 MIL/uL   Hemoglobin 13.1  12.0 - 15.0 g/dL   HCT 21.3  08.6 - 57.8 %   MCV 82.5  78.0 - 100.0 fL   MCH 28.4  26.0 - 34.0 pg   MCHC 34.4  30.0 - 36.0 g/dL   RDW 46.9  62.9 - 52.8 %   Platelets 234  150 - 400 K/uL   Neutrophils Relative 73  43 - 77 %   Neutro Abs 4.8  1.7 - 7.7 K/uL   Lymphocytes Relative 21  12 - 46 %   Lymphs Abs 1.4  0.7 - 4.0 K/uL   Monocytes Relative 5  3 - 12 %   Monocytes Absolute 0.4  0.1 - 1.0 K/uL   Eosinophils Relative 1  0 - 5 %   Eosinophils Absolute 0.0  0.0 - 0.7 K/uL   Basophils Relative 1  0 - 1 %   Basophils Absolute 0.0  0.0 - 0.1 K/uL  TROPONIN I      Result Value Range   Troponin I <0.30  <0.30 ng/mL               Laray Anger, DO 03/16/13 1232

## 2013-03-14 NOTE — ED Notes (Signed)
Patient states " I will stay."  Patient aware that we need urine sample.  She verbalized that she will call when she is able to give the sample.

## 2013-03-14 NOTE — ED Notes (Signed)
Patient stating, "I'm ready to go now".  Will advise Clarene Duke, MD.

## 2013-03-15 DIAGNOSIS — R55 Syncope and collapse: Secondary | ICD-10-CM

## 2013-03-15 LAB — CBC
HCT: 34.8 % — ABNORMAL LOW (ref 36.0–46.0)
MCV: 80.9 fL (ref 78.0–100.0)
Platelets: 219 10*3/uL (ref 150–400)
RBC: 4.3 MIL/uL (ref 3.87–5.11)
WBC: 7 10*3/uL (ref 4.0–10.5)

## 2013-03-15 LAB — BASIC METABOLIC PANEL
CO2: 24 mEq/L (ref 19–32)
Calcium: 8.4 mg/dL (ref 8.4–10.5)
Chloride: 103 mEq/L (ref 96–112)
GFR calc Af Amer: 70 mL/min — ABNORMAL LOW (ref >90)
Sodium: 134 mEq/L — ABNORMAL LOW (ref 135–145)

## 2013-03-15 LAB — URINE CULTURE

## 2013-03-15 LAB — HEMOGLOBIN A1C
Hgb A1c MFr Bld: 5.7 % — ABNORMAL HIGH (ref <5.7)
Mean Plasma Glucose: 117 mg/dL — ABNORMAL HIGH (ref <117)

## 2013-03-15 LAB — TSH: TSH: 0.845 u[IU]/mL (ref 0.350–4.500)

## 2013-03-15 LAB — VITAMIN B12: Vitamin B-12: 639 pg/mL (ref 211–911)

## 2013-03-15 LAB — SEDIMENTATION RATE: Sed Rate: 13 mm/hr (ref 0–22)

## 2013-03-15 MED ORDER — IBUPROFEN 400 MG PO TABS
400.0000 mg | ORAL_TABLET | Freq: Four times a day (QID) | ORAL | Status: DC | PRN
  Filled 2013-03-15: qty 1

## 2013-03-15 NOTE — Care Management Note (Unsigned)
    Page 1 of 1   03/15/2013     3:48:08 PM   CARE MANAGEMENT NOTE 03/15/2013  Patient:  Taylor Foley, Taylor Foley   Account Number:  [id number]  Date Initiated:  03/15/2013  Documentation initiated by:  GRAVES-BIGELOW,Kree Rafter  Subjective/Objective Assessment:   Pt admitted with syncope and a fall resulting in wrist fracture.     Action/Plan:   CM will continue to monitor for disposition needs. Pt without insurance and received referral for medications assistance.   Anticipated DC Date:  03/18/2013   Anticipated DC Plan:  HOME W HOME HEALTH SERVICES      DC Planning Services  CM consult      Choice offered to / List presented to:             Status of service:  In process, will continue to follow Medicare Important Message given?   (If response is "NO", the following Medicare IM given date fields will be blank) Date Medicare IM given:   Date Additional Medicare IM given:    Discharge Disposition:    Per UR Regulation:  Reviewed for med. necessity/level of care/duration of stay  If discussed at Long Length of Stay Meetings, dates discussed:    Comments:  03-15-13 1522 Tomi Bamberger, RN,BSN [phone] CM did call Family Medicine at [facility]. CM will see if they are taking new patients at this time. Pt states she has a neighbor that looks in on her at least 3 x wk. Pt has a son that lives with her however in the process of moving out. They are accepting new pt's and an appointment was made for:  April 24 9:30  for orange card application. They will then set pt up for f/u appointment. If pt will be d/c on medication please make sure medication will be generic and can be purchased on $4.00 med list. No further needs from CM at this time.

## 2013-03-15 NOTE — Progress Notes (Signed)
*  PRELIMINARY RESULTS* Vascular Ultrasound Carotid Duplex (Doppler) has been completed.  Preliminary findings: Bilateral:  No evidence of hemodynamically significant internal carotid artery stenosis.   Vertebral artery flow is antegrade.      Jayme Cham FRANCES 03/15/2013, 12:01 PM

## 2013-03-15 NOTE — Progress Notes (Signed)
Patient states that she has intermittent periods where she "blacks out" during conversation and forgets to speak or look at who is speaking with her. She wishes to talk to her doctor when he/she rounds today concerning this.

## 2013-03-15 NOTE — Consult Note (Signed)
ELECTROPHYSIOLOGY CONSULT NOTE    Patient ID: Jeremiah Tarpley MRN: [id number], DOB/AGE: Sep 18, 1951 62 y.o.  Admit date: 03/14/2013 Date of Consult: 03-15-2013  Primary Physician: No PCP  Reason for Consultation: syncope  HPI:  Taylor Foley is a 62 year old female who presented to the ER yesterday after a syncopal spell.  EP has been asked to evaluate for treatment options.  She reports that yesterday morning, she woke up, got dressed, ate breakfast and was sitting outside on her porch.  The next thing she remembers is waking up to EMS on the scene asking her if she was okay.  She does not remember going down off the porch or walking the distance to where she was found.  She denies associated palpitations, dizziness, or other symptoms.  She has had one other prior episode of syncope in December 2012.  At that time, she was walking down the street back to her house and had an episode of syncope without prodrome.  She reports that episode was similar to this one.   Her past medical history is significant for hypertension and hyperlipidemia.  She also endorses joint aching and orthopnea.   She is unable to afford medications at this time and is not taking any home meds. She gets some meals from the [employer].    She has been unable to work for the last 13 years because of a wrist injury. Previously, she worked for [employer].  She has never been married and has a son who is expecting a baby soon that lives in [place].  She lives next to her sister.  She denies tobacco, alcohol, or recreational drug use.  Family history is notable for both parents dying of lung cancer.  She is unsure of her sister's medical history.   Past Medical History  Diagnosis Date  . Hypertension   . Hypercholesteremia   . Syncope and collapse 11/2011; 03/14/2013  . Shortness of breath     "sometimes lying down" (03/14/2013)  . Blood in the urine     "here lately; q time I go to pee"  (03/14/2013)  . Arthritis     "all over" (03/24/2013)     Surgical History:  Past Surgical History  Procedure Laterality Date  . Orif wrist fracture  12/04/2011    Procedure: OPEN REDUCTION INTERNAL FIXATION (ORIF) WRIST FRACTURE;  Surgeon: Eldred Manges;  Location: [facility] OR;  Service: Orthopedics;  Laterality: Left;     No prescriptions prior to admission    Inpatient Medications:  . amLODipine  5 mg Oral Daily  . enoxaparin (LOVENOX) injection  40 mg Subcutaneous Q24H  . sodium chloride  3 mL Intravenous Q12H    Allergies: No Known Allergies  History   Social History  . Marital Status: Single    Spouse Name: N/A    Number of Children: N/A  . Years of Education: N/A   Occupational History  . Not on file.   Social History Main Topics  . Smoking status: Never Smoker   . Smokeless tobacco: Never Used  . Alcohol Use: No  . Drug Use: No  . Sexually Active: No   Other Topics Concern  . Not on file   Social History Narrative  . No narrative on file     Family History  Problem Relation Age of Onset  . Congestive Heart Failure Father   . Hypertension Father   . Hyperlipidemia Father   . Diabetes Other   .  Heart disease Other   . Prostate cancer Father   . Ovarian cancer Mother   . Hypertension Mother     Physical Exam  Well appearing obese middel aged woman, NAD HEENT: Unremarkable Neck:  7 cm JVD, no thyromegally Back:  No CVA tenderness Lungs:  Clear with no wheezes, rales, or rhonchi HEART:  Regular rate rhythm, no murmurs, no rubs, no clicks Abd:  Flat, positive bowel sounds, no organomegally, no rebound, no guarding Ext:  2 plus pulses, no edema, no cyanosis, no clubbing Skin:  No rashes no nodules Neuro:  CN II through XII intact, motor grossly intact   Labs:   Lab Results  Component Value Date   WBC 7.0 03/15/2013   HGB 12.1 03/15/2013   HCT 34.8* 03/15/2013   MCV 80.9 03/15/2013   PLT 219 03/15/2013    Recent Labs Lab 03/15/13 0530  NA 134*    K 3.9  CL 103  CO2 24  BUN 11  CREATININE 0.98  CALCIUM 8.4  GLUCOSE 107*   Lab Results  Component Value Date   CKTOTAL 91 03/14/2013   CKMB 1.8 12/03/2011   TROPONINI <0.30 03/14/2013    Lab Results  Component Value Date   DDIMER 0.33 03/15/2013     Radiology/Studies: Dg Chest 2 View 03/14/2013  *RADIOLOGY REPORT*  Clinical Data: Syncope  CHEST - 2 VIEW  Comparison: 10/19/2012  Findings: Cardiac leads obscure detail.  Heart size upper limits of normal.  The lungs are clear.  No pleural effusion.  No acute osseous finding.  IMPRESSION: Borderline cardiomegaly.  No focal acute abnormality.   Original Report Authenticated By: Christiana Pellant, M.D.    Dg Elbow Complete Left 03/14/2013  *RADIOLOGY REPORT*  Clinical Data: Fall, left elbow pain, posterior soft tissue laceration over the elbow  LEFT ELBOW - COMPLETE 3+ VIEW  Comparison: No similar prior study is available for comparison.  Findings: No fracture or dislocation.  No soft tissue abnormality. No radiopaque foreign body.  IMPRESSION: No acute osseous abnormality of the left elbow.   Original Report Authenticated By: Christiana Pellant, M.D.    Dg Wrist Complete Left 03/14/2013  *RADIOLOGY REPORT*  Clinical Data: Left wrist pain, prior surgery  LEFT WRIST - COMPLETE 3+ VIEW  Comparison: Prior radiographs of the left hand 12/02/2011  Findings: ORIF of a healed distal radius fracture with volar plate and screw construct.  No evidence of hardware complication or periprosthetic fracture.  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  The bones appear mildly osteopenic. Degenerative changes noted at the thumb carpometacarpal joint. There may be very mild widening of the scaphoid lunate interspace.  IMPRESSION:  1.  Query mild widening of the scaphoid lunate interspace as can be seen with injury of the scapholunate ligament. 2. Healed distal radius fracture status post volar plate ORIF. 3.  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   Original Report Authenticated By: Malachy Moan, M.D.    Ct Head Wo Contrast 03/14/2013  *RADIOLOGY REPORT*  Clinical Data: Loss of consciousness.  CT HEAD WITHOUT CONTRAST  Technique:  Contiguous axial images were obtained from the base of the skull through the vertex without contrast.  Comparison: MRI of 12/04/2011 and CT of 12/02/2011  Findings: Bone windows demonstrate no significant soft tissue swelling. Clear paranasal sinuses and mastoid air cells.  Soft tissue windows demonstrate mildly age advanced cerebral atrophy.  This is unchanged.  Moderate low density in  the periventricular white matter likely related to small vessel disease.This is increased since the prior.  Mega cisterna magna, a normal anatomic variant.  Global ventriculomegaly is unchanged since the prior.  No hemorrhage, mass lesion, cortically based acute infarct, intra- axial, or extra-axial fluid collection.  IMPRESSION: 1. No acute intracranial abnormality. 2.  Similar cerebral atrophy with progressive small vessel ischemic change. 3.  Similar global ventriculomegaly.  Although this could relate to underlying cerebral atrophy, normal pressure hydrocephalous could look similar.  Consider clinical exclusion.   Original Report Authenticated By: Jeronimo Greaves, M.D.    ZOX:WRUEA rhythm with normal intervals  TELEMETRY: sinus rhythm with very rare PVC's  ECHO: 12-05-2011-- EF 65-70%, no RWMA   A/P 1. Unexplained altered consciousness - I am not convinced that she has had a true syncopal episode. I cannot imagine a situation where an arrhythmia, either brady or tachy caused her symptoms. Her history is not good for a neurally mediated event either. Note workup to date. Ok for discharge from my perspective. No additional testing from a cardiac perspective is likely to be helpful.  Leonia Reeves.D.

## 2013-03-15 NOTE — H&P (Signed)
INTERNAL MEDICINE TEACHING SERVICE Attending Admission Note  Date: 03/15/2013  Patient name: Taylor Foley  Medical record number: [id number]  Date of birth: 1951/03/01    I have seen and evaluated Fabian Sharp and discussed their care with the Residency Team.  61 yr. Old female with pmhx significant for HTN, HL, previous syncopal episode with resulting wrist fracture, presents due to a syncopal episode. On history, the patient tells me that she was sitting outside on the porch to get some "sunlight" and remembers getting up to use the restroom. After that, she remembers seeing EMS around her.  She states she remembers no CP, SOB, diaphoresis, palpitations, but perhaps she did feel lightheaded immediately after getting up from a sitting position. This morning she states she felt it almost happened again, this time when she tried to get up from the bed. She states she sat up to use attempt to go to the restroom and feels like she "almost fainted".  Denied any CP or SOB, but states she did feel lightheaded. Her orthostatic vital signs were reviewed and were notable for a HR increase from 85 to 103 lying to standing.  Physical Exam: Blood pressure 158/85, pulse 85, temperature 98.9 F (37.2 C), temperature source Oral, resp. rate 18, height 5\' 8"  (1.727 m), weight 216 lb (97.977 kg), SpO2 97.00%.  General: Vital signs reviewed and noted. Well-developed, well-nourished, in no acute distress; alert, appropriate and cooperative throughout examination.  Head: Normocephalic, atraumatic.  Eyes: PERRL, EOMI, No signs of anemia or jaundince.  Nose: Mucous membranes moist, not inflammed, nonerythematous.  Throat: Oropharynx nonerythematous, no exudate appreciated.   Neck: No deformities, masses, or tenderness noted.Supple, No carotid Bruits, no JVD.  Lungs:  Normal respiratory effort. Clear to auscultation BL without crackles or wheezes.  Heart: RRR. S1 and S2 normal without gallop, murmur, or  rubs.  Abdomen:  BS normoactive. Soft, Nondistended, non-tender.  No masses or organomegaly.  Extremities: No pretibial edema.  Neurologic: A&O X3, CN II - XII are grossly intact. Motor strength is 5/5 in the all 4 extremities, Sensations intact to light touch, Cerebellar signs negative.  Skin: Small, 2X3 cm abrasion on left elbow.    Lab results: Results for orders placed during the hospital encounter of 03/14/13 (from the past 24 hour(s))  BASIC METABOLIC PANEL     Status: Abnormal   Collection Time    03/14/13  3:20 PM      Result Value Range   Sodium 134 (*) 135 - 145 mEq/L   Potassium 4.0  3.5 - 5.1 mEq/L   Chloride 100  96 - 112 mEq/L   CO2 27  19 - 32 mEq/L   Glucose, Bld 134 (*) 70 - 99 mg/dL   BUN 17  6 - 23 mg/dL   Creatinine, Ser 4.09  0.50 - 1.10 mg/dL   Calcium 8.7  8.4 - 81.1 mg/dL   GFR calc non Af Amer 61 (*) >90 mL/min   GFR calc Af Amer 70 (*) >90 mL/min  CBC WITH DIFFERENTIAL     Status: None   Collection Time    03/14/13  3:20 PM      Result Value Range   WBC 6.6  4.0 - 10.5 K/uL   RBC 4.62  3.87 - 5.11 MIL/uL   Hemoglobin 13.1  12.0 - 15.0 g/dL   HCT 91.4  78.2 - 95.6 %   MCV 82.5  78.0 - 100.0 fL   MCH 28.4  26.0 - 34.0 pg  MCHC 34.4  30.0 - 36.0 g/dL   RDW 16.1  09.6 - 04.5 %   Platelets 234  150 - 400 K/uL   Neutrophils Relative 73  43 - 77 %   Neutro Abs 4.8  1.7 - 7.7 K/uL   Lymphocytes Relative 21  12 - 46 %   Lymphs Abs 1.4  0.7 - 4.0 K/uL   Monocytes Relative 5  3 - 12 %   Monocytes Absolute 0.4  0.1 - 1.0 K/uL   Eosinophils Relative 1  0 - 5 %   Eosinophils Absolute 0.0  0.0 - 0.7 K/uL   Basophils Relative 1  0 - 1 %   Basophils Absolute 0.0  0.0 - 0.1 K/uL  TROPONIN I     Status: None   Collection Time    03/14/13  3:20 PM      Result Value Range   Troponin I <0.30  <0.30 ng/mL  URINALYSIS, ROUTINE W REFLEX MICROSCOPIC     Status: Abnormal   Collection Time    03/14/13  4:18 PM      Result Value Range   Color, Urine YELLOW  YELLOW    APPearance CLEAR  CLEAR   Specific Gravity, Urine 1.014  1.005 - 1.030   pH 7.0  5.0 - 8.0   Glucose, UA NEGATIVE  NEGATIVE mg/dL   Hgb urine dipstick TRACE (*) NEGATIVE   Bilirubin Urine NEGATIVE  NEGATIVE   Ketones, ur NEGATIVE  NEGATIVE mg/dL   Protein, ur NEGATIVE  NEGATIVE mg/dL   Urobilinogen, UA 0.2  0.0 - 1.0 mg/dL   Nitrite NEGATIVE  NEGATIVE   Leukocytes, UA NEGATIVE  NEGATIVE  URINE RAPID DRUG SCREEN (HOSP PERFORMED)     Status: None   Collection Time    03/14/13  4:18 PM      Result Value Range   Opiates NONE DETECTED  NONE DETECTED   Cocaine NONE DETECTED  NONE DETECTED   Benzodiazepines NONE DETECTED  NONE DETECTED   Amphetamines NONE DETECTED  NONE DETECTED   Tetrahydrocannabinol NONE DETECTED  NONE DETECTED   Barbiturates NONE DETECTED  NONE DETECTED  URINE MICROSCOPIC-ADD ON     Status: None   Collection Time    03/14/13  4:18 PM      Result Value Range   RBC / HPF 0-2  <3 RBC/hpf   Bacteria, UA RARE  RARE  CK     Status: None   Collection Time    03/14/13  7:18 PM      Result Value Range   Total CK 91  7 - 177 U/L  RPR     Status: None   Collection Time    03/14/13  7:18 PM      Result Value Range   RPR NON REACTIVE  NON REACTIVE  VITAMIN B12     Status: None   Collection Time    03/14/13  7:18 PM      Result Value Range   Vitamin B-12 639  211 - 911 pg/mL  HEMOGLOBIN A1C     Status: Abnormal   Collection Time    03/14/13  7:18 PM      Result Value Range   Hemoglobin A1C 5.7 (*) <5.7 %   Mean Plasma Glucose 117 (*) <117 mg/dL  ETHANOL     Status: None   Collection Time    03/14/13  7:18 PM      Result Value Range   Alcohol, Ethyl (B) <11  0 - 11 mg/dL  TSH     Status: None   Collection Time    03/14/13  7:18 PM      Result Value Range   TSH 0.845  0.350 - 4.500 uIU/mL  SEDIMENTATION RATE     Status: None   Collection Time    03/15/13  5:30 AM      Result Value Range   Sed Rate 13  0 - 22 mm/hr  BASIC METABOLIC PANEL     Status:  Abnormal   Collection Time    03/15/13  5:30 AM      Result Value Range   Sodium 134 (*) 135 - 145 mEq/L   Potassium 3.9  3.5 - 5.1 mEq/L   Chloride 103  96 - 112 mEq/L   CO2 24  19 - 32 mEq/L   Glucose, Bld 107 (*) 70 - 99 mg/dL   BUN 11  6 - 23 mg/dL   Creatinine, Ser 1.61  0.50 - 1.10 mg/dL   Calcium 8.4  8.4 - 09.6 mg/dL   GFR calc non Af Amer 61 (*) >90 mL/min   GFR calc Af Amer 70 (*) >90 mL/min  CBC     Status: Abnormal   Collection Time    03/15/13  5:30 AM      Result Value Range   WBC 7.0  4.0 - 10.5 K/uL   RBC 4.30  3.87 - 5.11 MIL/uL   Hemoglobin 12.1  12.0 - 15.0 g/dL   HCT 04.5 (*) 40.9 - 81.1 %   MCV 80.9  78.0 - 100.0 fL   MCH 28.1  26.0 - 34.0 pg   MCHC 34.8  30.0 - 36.0 g/dL   RDW 91.4  78.2 - 95.6 %   Platelets 219  150 - 400 K/uL  D-DIMER, QUANTITATIVE     Status: None   Collection Time    03/15/13  8:33 AM      Result Value Range   D-Dimer, Quant 0.33  0.00 - 0.48 ug/mL-FEU    Imaging results:  Dg Chest 2 View  03/14/2013  *RADIOLOGY REPORT*  Clinical Data: Syncope  CHEST - 2 VIEW  Comparison: 10/19/2012  Findings: Cardiac leads obscure detail.  Heart size upper limits of normal.  The lungs are clear.  No pleural effusion.  No acute osseous finding.  IMPRESSION: Borderline cardiomegaly.  No focal acute abnormality.   Original Report Authenticated By: Christiana Pellant, M.D.    Dg Elbow Complete Left  03/14/2013  *RADIOLOGY REPORT*  Clinical Data: Fall, left elbow pain, posterior soft tissue laceration over the elbow  LEFT ELBOW - COMPLETE 3+ VIEW  Comparison: No similar prior study is available for comparison.  Findings: No fracture or dislocation.  No soft tissue abnormality. No radiopaque foreign body.  IMPRESSION: No acute osseous abnormality of the left elbow.   Original Report Authenticated By: Christiana Pellant, M.D.    Dg Wrist Complete Left  03/14/2013  *RADIOLOGY REPORT*  Clinical Data: Left wrist pain, prior surgery  LEFT WRIST - COMPLETE 3+ VIEW   Comparison: Prior radiographs of the left hand 12/02/2011  Findings: ORIF of a healed distal radius fracture with volar plate and screw construct.  No evidence of hardware complication or periprosthetic fracture.  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  The bones appear mildly osteopenic. Degenerative changes noted at the thumb carpometacarpal joint. There may be very mild widening of the scaphoid lunate interspace.  IMPRESSION:  1.  Query  mild widening of the scaphoid lunate interspace as can be seen with injury of the scapholunate ligament. 2. Healed distal radius fracture status post volar plate ORIF. 3.  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   Original Report Authenticated By: Malachy Moan, M.D.    Ct Head Wo Contrast  03/14/2013  *RADIOLOGY REPORT*  Clinical Data: Loss of consciousness.  CT HEAD WITHOUT CONTRAST  Technique:  Contiguous axial images were obtained from the base of the skull through the vertex without contrast.  Comparison: MRI of 12/04/2011 and CT of 12/02/2011  Findings: Bone windows demonstrate no significant soft tissue swelling. Clear paranasal sinuses and mastoid air cells.  Soft tissue windows demonstrate mildly age advanced cerebral atrophy.  This is unchanged.  Moderate low density in the periventricular white matter likely related to small vessel disease.This is increased since the prior.  Mega cisterna magna, a normal anatomic variant.  Global ventriculomegaly is unchanged since the prior.  No hemorrhage, mass lesion, cortically based acute infarct, intra- axial, or extra-axial fluid collection.  IMPRESSION: 1. No acute intracranial abnormality. 2.  Similar cerebral atrophy with progressive small vessel ischemic change. 3.  Similar global ventriculomegaly.  Although this could relate to underlying cerebral atrophy, normal pressure hydrocephalous could look similar.  Consider clinical  exclusion.   Original Report Authenticated By: Jeronimo Greaves, M.D.      Assessment and Plan: I agree with the formulated Assessment and Plan with the following changes:  61 yr. Old female with pmhx significant for HTN, HL, previous syncopal episode with resulting wrist fracture, presents due to a syncopal episode. 1) Syncope: This patient has a previous hx of a fracture sustained by a fall. Neurocardiogenic event sounds likely. I would rule her out for ACS, check a TTE, check carotid U/S, and obtain a d-dimer (if positive, rule her out for PE with CTA chest).  Otherwise, a tilt-table test may be necessary in her case. I would continue IVF at this time and recheck orthostatic vital signs afterwards, paying attention to symptoms and heart rate. I would not necessarily start a CCB at this time, such as amlodipine, as this could worsen her symptoms. -rest per resident note.   The treatment plan was discussed in detail with the patient.  Alternatives to treatment, side effects, risks and benefits, and complications were discussed with the patient. Informed consent was obtained. The patient agrees to proceed with the current treatment plan.  Jonah Blue, DO 4/11/201411:53 AM

## 2013-03-15 NOTE — Progress Notes (Signed)
Resident Co-sign Daily Note: I have seen the patient and reviewed the excellent daily progress note by Suzie Portela MS4 and discussed the care of the patient with them.  See below for documentation of my findings, assessment, and plans.  Subjective: The patient notes a mild headache this morning, as well as 1 episode of "feeling funny" after standing this morning from a seated position.  Orthostatics negative.  Objective: Vital signs in last 24 hours: Filed Vitals:   03/15/13 0543 03/15/13 0544 03/15/13 0548 03/15/13 1100  BP: 147/83 153/93 153/85 158/85  Pulse:      Temp:      TempSrc:      Resp:      Height:      Weight:      SpO2:       Physical Exam: General: alert, cooperative, and in no apparent distress HEENT: pupils equal round and reactive to light, vision grossly intact, oropharynx clear and non-erythematous  Neck: supple, no lymphadenopathy  Lungs: clear to ascultation bilaterally, normal work of respiration, no wheezes, rales, ronchi Heart: regular rate and rhythm, no murmurs, gallops, or rubs Abdomen: soft, non-tender, non-distended, normal bowel sounds Msk: no joint edema, warmth, or erythema  Extremities: no cyanosis, clubbing, or edema Neurologic: alert & oriented X3, cranial nerves II-XII intact, strength 5/5 throughout, reflexes 1+ throughout, intact finger-to-nose, intact heel-to-shin, regular gait normal, heel-to-toe gait impaired, romberg negative. Dix-Halpike negative   Lab Results: Reviewed and documented in Electronic Record Micro Results: Reviewed and documented in Electronic Record Studies/Results: Reviewed and documented in Electronic Record Medications: I have reviewed the patient's current medications. Scheduled Meds: . enoxaparin (LOVENOX) injection  40 mg Subcutaneous Q24H  . sodium chloride  3 mL Intravenous Q12H   Continuous Infusions:  PRN Meds:.acetaminophen, acetaminophen, docusate sodium, ibuprofen, ondansetron (ZOFRAN) IV,  ondansetron  Assessment/Plan: The patient is a 62 yo woman, history of HTN, presenting with recurrent syncope.   # Syncope - The patient presents after an episode of syncope.  Most likely etiology is neurocardiogenic.  Differential includes seizure (post-ictal confusion, but no tongue biting or loss of bowel/bladder continence) vs arrythmia (though no palpitations, echo 2012 unremarkable, tele unremarkable overnight) vs psychogenic.  No evidence of orthostatic hypotension vs hypoglycemia vs metabolic abnormality. CT head showed no abnormality. Carotid dopplers negative. -will attempt to obtain tilt table testing  # Dizziness - patient notes a 5-year history of dizziness, independent of syncope. Symptoms likely represent BPPV (dix-halpike negative, but patient asymptomatic at this time).  -see syncope work-up above   # Hypertension - history of HTN, currently not on any anti-hypertensive medications.  -hold anti-HTN in the setting of syncope work-up  # Prophy - lovenox   Dispo: Disposition is deferred at this time, awaiting improvement of current medical problems. Likely discharge tomorrow.   The patient does not have a current PCP (Provider Not In System), therefore will not be requiring [facility] follow-up after discharge.    LOS: 1 day   Janalyn Harder 03/15/2013, 1:11 PM

## 2013-03-15 NOTE — Progress Notes (Signed)
Medical Student Daily Progress Note  Subjective: Taylor Foley notes that she had some periods of lightheadedness when she got up to go to the bathroom yesterday, but denies any other fainting spells.  She does endorse some musculoskeletal neck pain and mild headache, but denies any other complaints.   Objective: Vital signs in last 24 hours: Filed Vitals:   03/15/13 0500 03/15/13 0543 03/15/13 0544 03/15/13 0548  BP: 146/73 147/83 153/93 153/85  Pulse: 85     Temp: 98.9 F (37.2 C)     TempSrc: Oral     Resp:      Height:      Weight: 97.977 kg (216 lb)     SpO2: 97%      Weight change:   Intake/Output Summary (Last 24 hours) at 03/15/13 1040 Last data filed at 03/15/13 0900  Gross per 24 hour  Intake   1240 ml  Output      3 ml  Net   1237 ml   Physical Exam: BP 153/85  Pulse 85  Temp(Src) 98.9 F (37.2 C) (Oral)  Resp 18  Ht 5\' 8"  (1.727 m)  Wt 97.977 kg (216 lb)  BMI 32.85 kg/m2  SpO2 97%  General Appearance: Alert, cooperative, no distress, appears stated age   Head:   Normocephalic, without obvious abnormality, atraumatic   Eyes:   PERRL, conjunctiva/corneas clear, EOM's intact, fundi benign, both eyes   Neck:    Supple, symmetrical, no adenopathy; no carrotid bruit or JVD   Back:   Symmetric, no curvature, ROM normal, no CVA tenderness   Lungs:   Clear to auscultation bilaterally, respirations unlabored    Heart:   Regular rate and rhythm, S1 and S2 normal, no murmur, rub   or gallop   Abdomen:  Soft, non-tender, bowel sounds active all four quadrants,  no masses, no organomegaly   Extremities:  Extremities normal, laceration noted over left elbow, no cyanosis or edema   Pulses:   2+ and symmetric all extremities   Neurologic:  CNII-XII intact, normal strength, sensation and reflexes throughout, normal heel-to-shin testing.  Minicog test: normal clock face, 0/3 items recalled    Lab Results:  Results for orders placed during the hospital encounter of  03/14/13 (from the past 24 hour(s))  BASIC METABOLIC PANEL     Status: Abnormal   Collection Time    03/14/13  3:20 PM      Result Value Range   Sodium 134 (*) 135 - 145 mEq/L   Potassium 4.0  3.5 - 5.1 mEq/L   Chloride 100  96 - 112 mEq/L   CO2 27  19 - 32 mEq/L   Glucose, Bld 134 (*) 70 - 99 mg/dL   BUN 17  6 - 23 mg/dL   Creatinine, Ser 9.52  0.50 - 1.10 mg/dL   Calcium 8.7  8.4 - 84.1 mg/dL   GFR calc non Af Amer 61 (*) >90 mL/min   GFR calc Af Amer 70 (*) >90 mL/min  CBC WITH DIFFERENTIAL     Status: None   Collection Time    03/14/13  3:20 PM      Result Value Range   WBC 6.6  4.0 - 10.5 K/uL   RBC 4.62  3.87 - 5.11 MIL/uL   Hemoglobin 13.1  12.0 - 15.0 g/dL   HCT 32.4  40.1 - 02.7 %   MCV 82.5  78.0 - 100.0 fL   MCH 28.4  26.0 - 34.0 pg   MCHC  34.4  30.0 - 36.0 g/dL   RDW 84.1  32.4 - 40.1 %   Platelets 234  150 - 400 K/uL   Neutrophils Relative 73  43 - 77 %   Neutro Abs 4.8  1.7 - 7.7 K/uL   Lymphocytes Relative 21  12 - 46 %   Lymphs Abs 1.4  0.7 - 4.0 K/uL   Monocytes Relative 5  3 - 12 %   Monocytes Absolute 0.4  0.1 - 1.0 K/uL   Eosinophils Relative 1  0 - 5 %   Eosinophils Absolute 0.0  0.0 - 0.7 K/uL   Basophils Relative 1  0 - 1 %   Basophils Absolute 0.0  0.0 - 0.1 K/uL  TROPONIN I     Status: None   Collection Time    03/14/13  3:20 PM      Result Value Range   Troponin I <0.30  <0.30 ng/mL  URINALYSIS, ROUTINE W REFLEX MICROSCOPIC     Status: Abnormal   Collection Time    03/14/13  4:18 PM      Result Value Range   Color, Urine YELLOW  YELLOW   APPearance CLEAR  CLEAR   Specific Gravity, Urine 1.014  1.005 - 1.030   pH 7.0  5.0 - 8.0   Glucose, UA NEGATIVE  NEGATIVE mg/dL   Hgb urine dipstick TRACE (*) NEGATIVE   Bilirubin Urine NEGATIVE  NEGATIVE   Ketones, ur NEGATIVE  NEGATIVE mg/dL   Protein, ur NEGATIVE  NEGATIVE mg/dL   Urobilinogen, UA 0.2  0.0 - 1.0 mg/dL   Nitrite NEGATIVE  NEGATIVE   Leukocytes, UA NEGATIVE  NEGATIVE  URINE RAPID  DRUG SCREEN (HOSP PERFORMED)     Status: None   Collection Time    03/14/13  4:18 PM      Result Value Range   Opiates NONE DETECTED  NONE DETECTED   Cocaine NONE DETECTED  NONE DETECTED   Benzodiazepines NONE DETECTED  NONE DETECTED   Amphetamines NONE DETECTED  NONE DETECTED   Tetrahydrocannabinol NONE DETECTED  NONE DETECTED   Barbiturates NONE DETECTED  NONE DETECTED  URINE MICROSCOPIC-ADD ON     Status: None   Collection Time    03/14/13  4:18 PM      Result Value Range   RBC / HPF 0-2  <3 RBC/hpf   Bacteria, UA RARE  RARE  CK     Status: None   Collection Time    03/14/13  7:18 PM      Result Value Range   Total CK 91  7 - 177 U/L  RPR     Status: None   Collection Time    03/14/13  7:18 PM      Result Value Range   RPR NON REACTIVE  NON REACTIVE  VITAMIN B12     Status: None   Collection Time    03/14/13  7:18 PM      Result Value Range   Vitamin B-12 639  211 - 911 pg/mL  HEMOGLOBIN A1C     Status: Abnormal   Collection Time    03/14/13  7:18 PM      Result Value Range   Hemoglobin A1C 5.7 (*) <5.7 %   Mean Plasma Glucose 117 (*) <117 mg/dL  ETHANOL     Status: None   Collection Time    03/14/13  7:18 PM      Result Value Range   Alcohol, Ethyl (B) <11  0 -  11 mg/dL  TSH     Status: None   Collection Time    03/14/13  7:18 PM      Result Value Range   TSH 0.845  0.350 - 4.500 uIU/mL  SEDIMENTATION RATE     Status: None   Collection Time    03/15/13  5:30 AM      Result Value Range   Sed Rate 13  0 - 22 mm/hr  BASIC METABOLIC PANEL     Status: Abnormal   Collection Time    03/15/13  5:30 AM      Result Value Range   Sodium 134 (*) 135 - 145 mEq/L   Potassium 3.9  3.5 - 5.1 mEq/L   Chloride 103  96 - 112 mEq/L   CO2 24  19 - 32 mEq/L   Glucose, Bld 107 (*) 70 - 99 mg/dL   BUN 11  6 - 23 mg/dL   Creatinine, Ser 1.61  0.50 - 1.10 mg/dL   Calcium 8.4  8.4 - 09.6 mg/dL   GFR calc non Af Amer 61 (*) >90 mL/min   GFR calc Af Amer 70 (*) >90 mL/min   CBC     Status: Abnormal   Collection Time    03/15/13  5:30 AM      Result Value Range   WBC 7.0  4.0 - 10.5 K/uL   RBC 4.30  3.87 - 5.11 MIL/uL   Hemoglobin 12.1  12.0 - 15.0 g/dL   HCT 04.5 (*) 40.9 - 81.1 %   MCV 80.9  78.0 - 100.0 fL   MCH 28.1  26.0 - 34.0 pg   MCHC 34.8  30.0 - 36.0 g/dL   RDW 91.4  78.2 - 95.6 %   Platelets 219  150 - 400 K/uL  D-DIMER, QUANTITATIVE     Status: None   Collection Time    03/15/13  8:33 AM      Result Value Range   D-Dimer, Quant 0.33  0.00 - 0.48 ug/mL-FEU      Micro Results: No results found for this or any previous visit (from the past 240 hour(s)). Studies/Results: Dg Chest 2 View  03/14/2013  *RADIOLOGY REPORT*  Clinical Data: Syncope  CHEST - 2 VIEW  Comparison: 10/19/2012  Findings: Cardiac leads obscure detail.  Heart size upper limits of normal.  The lungs are clear.  No pleural effusion.  No acute osseous finding.  IMPRESSION: Borderline cardiomegaly.  No focal acute abnormality.   Original Report Authenticated By: Christiana Pellant, M.D.    Dg Elbow Complete Left  03/14/2013  *RADIOLOGY REPORT*  Clinical Data: Fall, left elbow pain, posterior soft tissue laceration over the elbow  LEFT ELBOW - COMPLETE 3+ VIEW  Comparison: No similar prior study is available for comparison.  Findings: No fracture or dislocation.  No soft tissue abnormality. No radiopaque foreign body.  IMPRESSION: No acute osseous abnormality of the left elbow.   Original Report Authenticated By: Christiana Pellant, M.D.    Dg Wrist Complete Left  03/14/2013  *RADIOLOGY REPORT*  Clinical Data: Left wrist pain, prior surgery  LEFT WRIST - COMPLETE 3+ VIEW  Comparison: Prior radiographs of the left hand 12/02/2011  Findings: ORIF of a healed distal radius fracture with volar plate and screw construct.  No evidence of hardware complication or periprosthetic fracture.  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  The bones appear mildly osteopenic.  Degenerative changes noted at the thumb carpometacarpal joint. There may be  very mild widening of the scaphoid lunate interspace.  IMPRESSION:  1.  Query mild widening of the scaphoid lunate interspace as can be seen with injury of the scapholunate ligament. 2. Healed distal radius fracture status post volar plate ORIF. 3.  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   Original Report Authenticated By: Malachy Moan, M.D.    Ct Head Wo Contrast  03/14/2013  *RADIOLOGY REPORT*  Clinical Data: Loss of consciousness.  CT HEAD WITHOUT CONTRAST  Technique:  Contiguous axial images were obtained from the base of the skull through the vertex without contrast.  Comparison: MRI of 12/04/2011 and CT of 12/02/2011  Findings: Bone windows demonstrate no significant soft tissue swelling. Clear paranasal sinuses and mastoid air cells.  Soft tissue windows demonstrate mildly age advanced cerebral atrophy.  This is unchanged.  Moderate low density in the periventricular white matter likely related to small vessel disease.This is increased since the prior.  Mega cisterna magna, a normal anatomic variant.  Global ventriculomegaly is unchanged since the prior.  No hemorrhage, mass lesion, cortically based acute infarct, intra- axial, or extra-axial fluid collection.  IMPRESSION: 1. No acute intracranial abnormality. 2.  Similar cerebral atrophy with progressive small vessel ischemic change. 3.  Similar global ventriculomegaly.  Although this could relate to underlying cerebral atrophy, normal pressure hydrocephalous could look similar.  Consider clinical exclusion.   Original Report Authenticated By: Jeronimo Greaves, M.D.    Medications: I have reviewed the patient's current medications. Scheduled Meds: . amLODipine  5 mg Oral Daily  . enoxaparin (LOVENOX) injection  40 mg Subcutaneous Q24H  . sodium chloride  3 mL Intravenous Q12H   Continuous Infusions:  PRN  Meds:.acetaminophen, acetaminophen, docusate sodium, ibuprofen, ondansetron (ZOFRAN) IV, ondansetron  Assessment/Plan: Taylor Foley is a 61yo woman with history of syncopal episodes presenting after syncope episode and fall in stable condition here for evaluation of causes of her syncope.  # Syncope - The patient presents after an episode of syncope. Differential includes seizure (post-ictal confusion, but no tongue biting or loss of bowel/bladder continence) vs arrythmia (though no palpitations, echo 2012 unremarkable) vs orthostatic hypotension vs hypoglycemia vs metabolic abnormality. CT head showed no abnormality.   Workup has been negative so far, normal B12, TSH, RPR, A1C, UDS, Etoh Level, CK.  Orthostatic vitals were normal as well.  D-dimer was also obtained to rule out chronic pulmonary embolus and this was also negative.  Workup so far has not revealed any structural or neurologic causes.  Will consider tilt-table testing to assess for vasovagal syncope.  Carotid doppler study is scheduled to evaluate for TIA.  Must also consider psychogenic cause of her episodes as well.  If remainder of workup does not reveal source of her syncope, recommend further evaluation in the outpatient setting. - continue telemetry  - EP consult ordered for tilt-table testing -consider holter monitor -carotid dopplers   # Dizziness - patient notes a 5-year history of dizziness, independent of syncope. Differential includes BPPV (though dix-halpike neg) vs ?orthostatic hypotension vs vestibular neuronitis (though no preceding viral illness).   Orthostatic vitals were normal -see syncope work-up above   # Hypertension - history of HTN, currently not on any anti-hypertensive medications.  -amlodipine 5mg  daily  # Prophy - lovenox   Dispo: Anticipate discharge to home pending completion of workup.  Anticipated discharge in approximately 1-2 day(s).  The patient does not have a current PCP (Provider Not In  System), therefore will not  be requiring OPC follow-up after discharge.     LOS: 1 day   This is a Psychologist, occupational Note.  The care of the patient was discussed with Dr. Manson Passey and the assessment and plan formulated with their assistance.  Please see their attached note for official documentation of the daily encounter.  Saddie Benders, [place] 03/15/2013, 10:40 AM

## 2013-03-16 MED ORDER — IBUPROFEN 400 MG PO TABS
400.0000 mg | ORAL_TABLET | Freq: Once | ORAL | Status: DC
  Filled 2013-03-16: qty 1

## 2013-03-16 NOTE — Discharge Summary (Signed)
Internal Medicine Teaching [facility] Discharge Note  Name: Taylor Foley MRN: [id number] DOB: 01-05-1951 62 y.o.  Date of Admission: 03/14/2013  1:32 PM Date of Discharge: 03/16/2013 Attending Physician: Jonah Blue, DO  Discharge Diagnosis: 1. Syncope - likely neurocardiogenic 2. Dizziness 3. Hypertension  Discharge Medications:   Medication List     As of 03/16/2013 12:03 PM    Notice      You have not been prescribed any medications.         Disposition and follow-up:   Ms.Taylor Foley was discharged from [facility] in stable and improved condition, with no further episodes of dizziness.  The patient will follow-up with the [facility] to establish care.  The patient also expressed interest in pursuing psychiatric care at [facility], and was given information regarding their walk-in services.    Follow-up Appointments: Follow-up Information   Follow up On 03/28/2013. (@ 9:30 to fill out an application for orange card. Then a f/u appointment will be made while in the office. )    Contact information:   Family Medicine @ [facility]( [facility]).  [address] [place] [address] [phone]        Follow up with [facility]. (Walk-in appointments available Monday-Thursday 8am-3pm, and Friday 8am-1pm)    Contact information:   [address] [place] [address] [phone]      Follow up with [facility]. Call in 2 days. (Call for appointment for tilt-table testing)    Contact information:   [address] [place] [address] [phone]          Discharge Orders   Future Orders Complete By Expires     Call MD for:  persistant dizziness or light-headedness  As directed     Diet - low sodium heart healthy  As directed     Increase activity slowly  As directed        Consultations: None  Procedures Performed:  Dg Chest 2 View  03/14/2013  *RADIOLOGY  REPORT*  Clinical Data: Syncope  CHEST - 2 VIEW  Comparison: 10/19/2012  Findings: Cardiac leads obscure detail.  Heart size upper limits of normal.  The lungs are clear.  No pleural effusion.  No acute osseous finding.  IMPRESSION: Borderline cardiomegaly.  No focal acute abnormality.   Original Report Authenticated By: Christiana Pellant, M.D.    Dg Elbow Complete Left  03/14/2013  *RADIOLOGY REPORT*  Clinical Data: Fall, left elbow pain, posterior soft tissue laceration over the elbow  LEFT ELBOW - COMPLETE 3+ VIEW  Comparison: No similar prior study is available for comparison.  Findings: No fracture or dislocation.  No soft tissue abnormality. No radiopaque foreign body.  IMPRESSION: No acute osseous abnormality of the left elbow.   Original Report Authenticated By: Christiana Pellant, M.D.    Dg Wrist Complete Left  03/14/2013  *RADIOLOGY REPORT*  Clinical Data: Left wrist pain, prior surgery  LEFT WRIST - COMPLETE 3+ VIEW  Comparison: Prior radiographs of the left hand 12/02/2011  Findings: ORIF of a healed distal radius fracture with volar plate and screw construct.  No evidence of hardware complication or periprosthetic fracture.  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  The bones appear mildly osteopenic. Degenerative changes noted at the thumb carpometacarpal joint. There may be very mild widening of the scaphoid lunate interspace.  IMPRESSION:  1.  Query mild widening of the scaphoid lunate interspace as can  be seen with injury of the scapholunate ligament. 2. Healed distal radius fracture status post volar plate ORIF. 3.  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   Original Report Authenticated By: Malachy Moan, M.D.    Ct Head Wo Contrast  03/14/2013  *RADIOLOGY REPORT*  Clinical Data: Loss of consciousness.  CT HEAD WITHOUT CONTRAST  Technique:  Contiguous axial images were obtained from the base of the skull  through the vertex without contrast.  Comparison: MRI of 12/04/2011 and CT of 12/02/2011  Findings: Bone windows demonstrate no significant soft tissue swelling. Clear paranasal sinuses and mastoid air cells.  Soft tissue windows demonstrate mildly age advanced cerebral atrophy.  This is unchanged.  Moderate low density in the periventricular white matter likely related to small vessel disease.This is increased since the prior.  Mega cisterna magna, a normal anatomic variant.  Global ventriculomegaly is unchanged since the prior.  No hemorrhage, mass lesion, cortically based acute infarct, intra- axial, or extra-axial fluid collection.  IMPRESSION: 1. No acute intracranial abnormality. 2.  Similar cerebral atrophy with progressive small vessel ischemic change. 3.  Similar global ventriculomegaly.  Although this could relate to underlying cerebral atrophy, normal pressure hydrocephalous could look similar.  Consider clinical exclusion.   Original Report Authenticated By: Jeronimo Greaves, M.D.     Admission HPI:  The patient is a 61 yo W, history of HTN, HL, 1 prior syncopal episode, presenting with syncope. The patient was walking outside from her house, when she had an unwitnessed fall with LOC. Her sister notes that EMS found her on the ground, lying on her left side. When aroused, the patient was disoriented for a few minutes, then realized where she was. She notes no preceding dizziness, palpitations, chest pain, SOB, visual/auditory/gustatory aura, or headache. She notes normal PO intake lately. Bystanders report no limb jerking movements, and she notes no incontinence of urine or stool. The patient had a similar episode 11/2011 of syncope with no surrounding symptoms, resulting in wrist fracture (s/p ORIF), and work-up at that time was essentially negative. MRI showed possible hydrocephalus, but neurology consult at that time did not believe this was the cause of the patient's symptoms.  The patient notes  symptoms of occasional "dizziness" over the last 5 years, which occur once/week with no particular pattern. She has not noticed this to happen particularly often when standing from a sitting position, or with turning her head. She did not feel dizzy prior to today's episode of syncope. She also notes "walking like I'm drunk" sometimes, noting feeling imbalanced with an erratic gait, though these symptoms can occur independently of her dizziness. She notes urinary frequency at baseline, but no urinary incontinence. The patient notes that she is currently taking no medications.  Admission Physical Exam Blood pressure 162/93, pulse 75, temperature 98.1 F (36.7 C), temperature source Oral, resp. rate 18, height 5\' 8"  (1.727 m), weight 216 lb 1.6 oz (98.022 kg), SpO2 100.00%.  General: alert, cooperative, and in no apparent distress HEENT: pupils equal round and reactive to light, vision grossly intact, oropharynx clear and non-erythematous  Neck: supple, no lymphadenopathy  Lungs: clear to ascultation bilaterally, normal work of respiration, no wheezes, rales, ronchi Heart: regular rate and rhythm, no murmurs, gallops, or rubs Abdomen: soft, non-tender, non-distended, normal bowel sounds Msk: no joint edema, warmth, or erythema  Extremities: no cyanosis, clubbing, or edema Neurologic: alert & oriented X3, cranial nerves II-XII intact, strength 5/5 throughout, reflexes 1+  throughout, intact finger-to-nose, regular gait normal, heel-to-toe gait impaired, romberg negative. Dix-Halpike negative  Admission Labs Basic Metabolic Panel:   03/14/13 1520   NA  134*   K  4.0   CL  100   CO2  27   GLUCOSE  134*   BUN  17   CREATININE  0.98   CALCIUM  8.7    CBC:   03/14/13 1520   WBC  6.6   NEUTROABS  4.8   HGB  13.1   HCT  38.1   MCV  82.5   PLT  234    Cardiac Enzymes:   03/14/13 1520   TROPONINI  <0.30    Urinalysis:   03/14/13 1618   COLORURINE  YELLOW   LABSPEC  1.014   PHURINE   7.0   GLUCOSEU  NEGATIVE   HGBUR  TRACE*   BILIRUBINUR  NEGATIVE   KETONESUR  NEGATIVE   PROTEINUR  NEGATIVE   UROBILINOGEN  0.2   NITRITE  NEGATIVE   LEUKOCYTESUR  NEGATIVE     Hospital Course by problem list: 1. Syncope - The patient presents after a syncopal episode after walking in the sun for an extended period of time.  The patient had 1 similar episode of syncope in 2012, which resulted in a wrist fracure, work-up of which included neurology consult, MRI of the brain, and echocardiography, in addition to extensive lab work-up, with no specific etiology for the fall identified.  During this admission, work-up showed no evidence of PE (negative d-dimer, low Wells score), carotid stenosis (carotid dopplers negative), arrythmia (no abnormalities on EKG or telemetry), orthostatic hypotension, or metabolic abnormality.  Seizure was considered unlikely, despite some confusion after the event, due to lack of other supporting symptoms (loss of bowel/bladder, tongue biting, etc.).  Resulting from the fall, the patient had no evidence of head injury (CT head negative), rhabdomyolysis (CK = 91), or broken bones (several negative x-rays).  The etiology of the patient's syncope was thought to be likely neurocardiogenic in etiology.  Tilt table testing was unable to be performed during her hospitalization, but after talking with [facility], the patient can obtain this study as an outpatient to complete her work-up.  2. Dizziness - the patient notes occasional symptoms of dizziness, which have been occurring over the past several years, but which did not occur on the day of her syncope, and did not occur during this hospitalization.  The etiology of this complaint is unclear, as it appears chronic and was not experienced during this hospitalization, but may represent BPPV (dix-halpike test negative during this hospitalization, though performed while patient asymptomatic.  3. Hypertension  - the patient reports a history of HTN, and BP's were elevated during this hospitalization.  She refused anti-hypertensive medication during this hospitalization.  She was given an appointment with [facility] to establish a PCP, where this issue can be further addressed.  Time spent on discharge: 45 minutes  Discharge Vitals:  BP 149/97  Pulse 83  Temp(Src) 99.6 F (37.6 C) (Oral)  Resp 18  Ht 5\' 8"  (1.727 m)  Wt 216 lb (97.977 kg)  BMI 32.85 kg/m2  SpO2 97%  Discharge Labs: No results found for this or any previous visit (from the past 24 hour(s)).  SignedJanalyn Harder 03/16/2013, 12:03 PM

## 2013-03-16 NOTE — Progress Notes (Signed)
Subjective: The patient continues to note mild headache and right shoulder msk pain this morning, but reports no further episodes of syncope.  Patient amenable to discharge today.  Objective: Vital signs in last 24 hours: Filed Vitals:   03/15/13 1100 03/15/13 1416 03/15/13 2037 03/16/13 0529  BP: 158/85 168/84 162/75 149/97  Pulse:   79 83  Temp:  98.4 F (36.9 C) 100.4 F (38 C) 99.6 F (37.6 C)  TempSrc:  Oral Oral Oral  Resp:  18    Height:      Weight:    216 lb (97.977 kg)  SpO2:  98% 99% 97%   Weight change: 16 lb (7.258 kg)  Intake/Output Summary (Last 24 hours) at 03/16/13 0948 Last data filed at 03/15/13 1700  Gross per 24 hour  Intake    120 ml  Output      0 ml  Net    120 ml   PEX General: alert, cooperative, and in no apparent distress HEENT: pupils equal round and reactive to light, vision grossly intact, oropharynx clear and non-erythematous  Neck: supple, no lymphadenopathy  Lungs: clear to ascultation bilaterally, normal work of respiration, no wheezes, rales, ronchi Heart: regular rate and rhythm, no murmurs, gallops, or rubs Abdomen: soft, non-tender, non-distended, normal bowel sounds Msk: no joint edema, warmth, or erythema  Extremities: no cyanosis, clubbing, or edema Neurologic: alert & oriented X3, cranial nerves II-XII intact, strength 5/5 throughout, reflexes 1+ throughout, intact finger-to-nose, intact heel-to-shin, regular gait normal, tandem gait impaired, romberg negative. Dix-Halpike negative   Lab Results: Basic Metabolic Panel:  Recent Labs Lab 03/14/13 1520 03/15/13 0530  NA 134* 134*  K 4.0 3.9  CL 100 103  CO2 27 24  GLUCOSE 134* 107*  BUN 17 11  CREATININE 0.98 0.98  CALCIUM 8.7 8.4   CBC:  Recent Labs Lab 03/14/13 1520 03/15/13 0530  WBC 6.6 7.0  NEUTROABS 4.8  --   HGB 13.1 12.1  HCT 38.1 34.8*  MCV 82.5 80.9  PLT 234 219   Cardiac Enzymes:  Recent Labs Lab 03/14/13 1520 03/14/13 1918  CKTOTAL  --  91   TROPONINI <0.30  --    D-Dimer:  Recent Labs Lab 03/15/13 0833  DDIMER 0.33   Hemoglobin A1C:  Recent Labs Lab 03/14/13 1918  HGBA1C 5.7*   Thyroid Function Tests:  Recent Labs Lab 03/14/13 1918  TSH 0.845    Micro Results: Recent Results (from the past 240 hour(s))  URINE CULTURE     Status: None   Collection Time    03/14/13  4:18 PM      Result Value Range Status   Specimen Description URINE, CLEAN CATCH   Final   Special Requests NONE   Final   Culture  Setup Time 03/14/2013 17:05   Final   Colony Count 2,000 COLONIES/ML   Final   Culture INSIGNIFICANT GROWTH   Final   Report Status 03/15/2013 FINAL   Final   Studies/Results: Dg Chest 2 View  03/14/2013  *RADIOLOGY REPORT*  Clinical Data: Syncope  CHEST - 2 VIEW  Comparison: 10/19/2012  Findings: Cardiac leads obscure detail.  Heart size upper limits of normal.  The lungs are clear.  No pleural effusion.  No acute osseous finding.  IMPRESSION: Borderline cardiomegaly.  No focal acute abnormality.   Original Report Authenticated By: Christiana Pellant, M.D.    Dg Elbow Complete Left  03/14/2013  *RADIOLOGY REPORT*  Clinical Data: Fall, left elbow pain, posterior soft tissue laceration  over the elbow  LEFT ELBOW - COMPLETE 3+ VIEW  Comparison: No similar prior study is available for comparison.  Findings: No fracture or dislocation.  No soft tissue abnormality. No radiopaque foreign body.  IMPRESSION: No acute osseous abnormality of the left elbow.   Original Report Authenticated By: Christiana Pellant, M.D.    Dg Wrist Complete Left  03/14/2013  *RADIOLOGY REPORT*  Clinical Data: Left wrist pain, prior surgery  LEFT WRIST - COMPLETE 3+ VIEW  Comparison: Prior radiographs of the left hand 12/02/2011  Findings: ORIF of a healed distal radius fracture with volar plate and screw construct.  No evidence of hardware complication or periprosthetic fracture.  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  The  bones appear mildly osteopenic. Degenerative changes noted at the thumb carpometacarpal joint. There may be very mild widening of the scaphoid lunate interspace.  IMPRESSION:  1.  Query mild widening of the scaphoid lunate interspace as can be seen with injury of the scapholunate ligament. 2. Healed distal radius fracture status post volar plate ORIF. 3.  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   Original Report Authenticated By: Malachy Moan, M.D.    Ct Head Wo Contrast  03/14/2013  *RADIOLOGY REPORT*  Clinical Data: Loss of consciousness.  CT HEAD WITHOUT CONTRAST  Technique:  Contiguous axial images were obtained from the base of the skull through the vertex without contrast.  Comparison: MRI of 12/04/2011 and CT of 12/02/2011  Findings: Bone windows demonstrate no significant soft tissue swelling. Clear paranasal sinuses and mastoid air cells.  Soft tissue windows demonstrate mildly age advanced cerebral atrophy.  This is unchanged.  Moderate low density in the periventricular white matter likely related to small vessel disease.This is increased since the prior.  Mega cisterna magna, a normal anatomic variant.  Global ventriculomegaly is unchanged since the prior.  No hemorrhage, mass lesion, cortically based acute infarct, intra- axial, or extra-axial fluid collection.  IMPRESSION: 1. No acute intracranial abnormality. 2.  Similar cerebral atrophy with progressive small vessel ischemic change. 3.  Similar global ventriculomegaly.  Although this could relate to underlying cerebral atrophy, normal pressure hydrocephalous could look similar.  Consider clinical exclusion.   Original Report Authenticated By: Jeronimo Greaves, M.D.    Medications: I have reviewed the patient's current medications. Scheduled Meds: . enoxaparin (LOVENOX) injection  40 mg Subcutaneous Q24H  . ibuprofen  400 mg Oral Once  . sodium chloride  3 mL Intravenous Q12H    Continuous Infusions:  PRN Meds:.acetaminophen, acetaminophen, docusate sodium, ibuprofen, ondansetron (ZOFRAN) IV, ondansetron  Assessment/Plan: The patient is a 62 yo woman, history of HTN, presenting with syncope.   # Syncope - The patient presents after an episode of syncope, which has happened one in the past (2012).  Work-up at that time included MRI and neurology consult, which showed no definite etiology. Most likely etiology is neurocardiogenic. Less likely etiologies include seizure (post-ictal confusion, but no tongue biting or loss of bowel/bladder continence) vs arrythmia (though no palpitations, echo 2012 unremarkable, tele unremarkable overnight) vs psychogenic. No evidence of orthostatic hypotension vs hypoglycemia vs metabolic abnormality. CT head showed no abnormality. Carotid dopplers negative.  -spoke with Dr. Allyson Sabal from [facility] today.  Patient will undergo tilt-table testing to complete work-up in the outpatient setting (not offered over the weekend)  # Dizziness - patient notes a 5-year history of dizziness, independent of syncope. Symptoms likely represent BPPV (dix-halpike negative, but patient  asymptomatic at this time).  -if this recurs, patient may benefit from vestibular rehab in the outpatient setting  # Hypertension - history of HTN, currently not on any anti-hypertensive medications.  -patient has refused anti-HTN medications during this admission, stating she doesn't like to take medications.  Will defer to the outpatient setting  # Headache - benign tension headache, with no concerning neurologic features, and CT head negative -ibuprofen for pain  # Prophy - lovenox   Dispo: Discharge home today.    LOS: 2 days   Janalyn Harder 03/16/2013, 9:48 AM

## 2013-03-18 NOTE — Discharge Summary (Signed)
INTERNAL MEDICINE TEACHING SERVICE Attending Note  Date: 03/18/2013  Patient name: Taylor Foley  Medical record number: [id number]  Date of birth: 1951-01-23   This patient has been  discussed with the house staff. Please see their note for complete details. I concur with their findings and plan.   The treatment plan was discussed in detail with the patient.  Alternatives to treatment, side effects, risks and benefits, and complications were discussed with the patient. Informed consent was obtained. The patient agrees to proceed with the current treatment plan.   Jonah Blue, DO  03/18/2013, 11:09 AM

## 2014-12-05 DIAGNOSIS — D494 Neoplasm of unspecified behavior of bladder: Secondary | ICD-10-CM

## 2014-12-05 HISTORY — DX: Neoplasm of unspecified behavior of bladder: D49.4

## 2014-12-25 ENCOUNTER — Encounter (HOSPITAL_COMMUNITY): Payer: Self-pay | Admitting: Emergency Medicine

## 2014-12-25 ENCOUNTER — Emergency Department (HOSPITAL_COMMUNITY): Payer: Medicaid Other

## 2014-12-25 ENCOUNTER — Inpatient Hospital Stay (HOSPITAL_COMMUNITY): Payer: Medicaid Other

## 2014-12-25 ENCOUNTER — Inpatient Hospital Stay (HOSPITAL_COMMUNITY)
Admission: EM | Admit: 2014-12-25 | Discharge: 2014-12-31 | DRG: 686 | Disposition: A | Discharge status: Skilled Nursing Facility | Payer: Medicaid Other | Attending: Internal Medicine | Admitting: Internal Medicine

## 2014-12-25 DIAGNOSIS — R918 Other nonspecific abnormal finding of lung field: Secondary | ICD-10-CM | POA: Diagnosis present

## 2014-12-25 DIAGNOSIS — G893 Neoplasm related pain (acute) (chronic): Secondary | ICD-10-CM | POA: Diagnosis present

## 2014-12-25 DIAGNOSIS — E780 Pure hypercholesterolemia: Secondary | ICD-10-CM | POA: Diagnosis present

## 2014-12-25 DIAGNOSIS — Z515 Encounter for palliative care: Secondary | ICD-10-CM

## 2014-12-25 DIAGNOSIS — C7970 Secondary malignant neoplasm of unspecified adrenal gland: Secondary | ICD-10-CM | POA: Diagnosis present

## 2014-12-25 DIAGNOSIS — F22 Delusional disorders: Secondary | ICD-10-CM | POA: Diagnosis present

## 2014-12-25 DIAGNOSIS — K7689 Other specified diseases of liver: Secondary | ICD-10-CM | POA: Diagnosis not present

## 2014-12-25 DIAGNOSIS — L03115 Cellulitis of right lower limb: Secondary | ICD-10-CM | POA: Diagnosis present

## 2014-12-25 DIAGNOSIS — R011 Cardiac murmur, unspecified: Secondary | ICD-10-CM | POA: Diagnosis not present

## 2014-12-25 DIAGNOSIS — C787 Secondary malignant neoplasm of liver and intrahepatic bile duct: Secondary | ICD-10-CM | POA: Diagnosis present

## 2014-12-25 DIAGNOSIS — C679 Malignant neoplasm of bladder, unspecified: Principal | ICD-10-CM | POA: Diagnosis present

## 2014-12-25 DIAGNOSIS — K769 Liver disease, unspecified: Secondary | ICD-10-CM | POA: Diagnosis present

## 2014-12-25 DIAGNOSIS — L03116 Cellulitis of left lower limb: Secondary | ICD-10-CM | POA: Diagnosis present

## 2014-12-25 DIAGNOSIS — G9340 Encephalopathy, unspecified: Secondary | ICD-10-CM | POA: Diagnosis present

## 2014-12-25 DIAGNOSIS — C799 Secondary malignant neoplasm of unspecified site: Secondary | ICD-10-CM

## 2014-12-25 DIAGNOSIS — F4323 Adjustment disorder with mixed anxiety and depressed mood: Secondary | ICD-10-CM | POA: Diagnosis present

## 2014-12-25 DIAGNOSIS — R410 Disorientation, unspecified: Secondary | ICD-10-CM | POA: Insufficient documentation

## 2014-12-25 DIAGNOSIS — G910 Communicating hydrocephalus: Secondary | ICD-10-CM | POA: Diagnosis present

## 2014-12-25 DIAGNOSIS — N390 Urinary tract infection, site not specified: Secondary | ICD-10-CM | POA: Diagnosis present

## 2014-12-25 DIAGNOSIS — Z599 Problem related to housing and economic circumstances, unspecified: Secondary | ICD-10-CM

## 2014-12-25 DIAGNOSIS — L03119 Cellulitis of unspecified part of limb: Secondary | ICD-10-CM

## 2014-12-25 DIAGNOSIS — N179 Acute kidney failure, unspecified: Secondary | ICD-10-CM | POA: Diagnosis present

## 2014-12-25 DIAGNOSIS — F418 Other specified anxiety disorders: Secondary | ICD-10-CM | POA: Diagnosis present

## 2014-12-25 DIAGNOSIS — R319 Hematuria, unspecified: Secondary | ICD-10-CM | POA: Diagnosis present

## 2014-12-25 DIAGNOSIS — I10 Essential (primary) hypertension: Secondary | ICD-10-CM | POA: Diagnosis present

## 2014-12-25 DIAGNOSIS — M1990 Unspecified osteoarthritis, unspecified site: Secondary | ICD-10-CM | POA: Diagnosis present

## 2014-12-25 DIAGNOSIS — M25551 Pain in right hip: Secondary | ICD-10-CM

## 2014-12-25 DIAGNOSIS — C7800 Secondary malignant neoplasm of unspecified lung: Secondary | ICD-10-CM | POA: Diagnosis present

## 2014-12-25 DIAGNOSIS — R079 Chest pain, unspecified: Secondary | ICD-10-CM | POA: Diagnosis not present

## 2014-12-25 DIAGNOSIS — E785 Hyperlipidemia, unspecified: Secondary | ICD-10-CM | POA: Diagnosis present

## 2014-12-25 LAB — CBC WITH DIFFERENTIAL/PLATELET
Basophils Absolute: 0 10*3/uL (ref 0.0–0.1)
Basophils Relative: 0 % (ref 0–1)
EOS ABS: 0.2 10*3/uL (ref 0.0–0.7)
Eosinophils Relative: 3 % (ref 0–5)
HEMATOCRIT: 34.7 % — AB (ref 36.0–46.0)
Hemoglobin: 11.3 g/dL — ABNORMAL LOW (ref 12.0–15.0)
LYMPHS PCT: 21 % (ref 12–46)
Lymphs Abs: 1.8 10*3/uL (ref 0.7–4.0)
MCH: 27.4 pg (ref 26.0–34.0)
MCHC: 32.6 g/dL (ref 30.0–36.0)
MCV: 84.2 fL (ref 78.0–100.0)
MONOS PCT: 8 % (ref 3–12)
Monocytes Absolute: 0.7 10*3/uL (ref 0.1–1.0)
NEUTROS ABS: 6 10*3/uL (ref 1.7–7.7)
Neutrophils Relative %: 68 % (ref 43–77)
Platelets: 345 10*3/uL (ref 150–400)
RBC: 4.12 MIL/uL (ref 3.87–5.11)
RDW: 13.4 % (ref 11.5–15.5)
WBC: 8.8 10*3/uL (ref 4.0–10.5)

## 2014-12-25 LAB — URINE MICROSCOPIC-ADD ON

## 2014-12-25 LAB — CBC
HEMATOCRIT: 36.8 % (ref 36.0–46.0)
Hemoglobin: 11.9 g/dL — ABNORMAL LOW (ref 12.0–15.0)
MCH: 27.5 pg (ref 26.0–34.0)
MCHC: 32.3 g/dL (ref 30.0–36.0)
MCV: 85 fL (ref 78.0–100.0)
PLATELETS: 392 10*3/uL (ref 150–400)
RBC: 4.33 MIL/uL (ref 3.87–5.11)
RDW: 13.4 % (ref 11.5–15.5)
WBC: 9.3 10*3/uL (ref 4.0–10.5)

## 2014-12-25 LAB — BASIC METABOLIC PANEL
Anion gap: 8 (ref 5–15)
BUN: 18 mg/dL (ref 6–23)
CO2: 26 mmol/L (ref 19–32)
Calcium: 9 mg/dL (ref 8.4–10.5)
Chloride: 103 mEq/L (ref 96–112)
Creatinine, Ser: 1.13 mg/dL — ABNORMAL HIGH (ref 0.50–1.10)
GFR, EST AFRICAN AMERICAN: 58 mL/min — AB (ref >90)
GFR, EST NON AFRICAN AMERICAN: 50 mL/min — AB (ref >90)
GLUCOSE: 105 mg/dL — AB (ref 70–99)
Potassium: 4.2 mmol/L (ref 3.5–5.1)
Sodium: 137 mmol/L (ref 135–145)

## 2014-12-25 LAB — URINALYSIS, ROUTINE W REFLEX MICROSCOPIC
GLUCOSE, UA: NEGATIVE mg/dL
Ketones, ur: 15 mg/dL — AB
Nitrite: NEGATIVE
Specific Gravity, Urine: 1.025 (ref 1.005–1.030)
Urobilinogen, UA: 1 mg/dL (ref 0.0–1.0)
pH: 8 (ref 5.0–8.0)

## 2014-12-25 LAB — I-STAT TROPONIN, ED: Troponin i, poc: 0.01 ng/mL (ref 0.00–0.08)

## 2014-12-25 LAB — I-STAT CG4 LACTIC ACID, ED: Lactic Acid, Venous: 0.73 mmol/L (ref 0.5–2.0)

## 2014-12-25 MED ORDER — DEXTROSE 5 % IV SOLN
1.0000 g | Freq: Three times a day (TID) | INTRAVENOUS | Status: DC
  Administered 2014-12-26 (×2): 1 g via INTRAVENOUS
  Filled 2014-12-25 (×3): qty 1

## 2014-12-25 MED ORDER — VANCOMYCIN HCL 10 G IV SOLR: 2000.0000 mg | Freq: Once | INTRAVENOUS | Status: DC

## 2014-12-25 MED ORDER — IOHEXOL 350 MG/ML SOLN
100.0000 mL | Freq: Once | INTRAVENOUS | Status: AC | PRN
  Administered 2014-12-25: 100 mL via INTRAVENOUS

## 2014-12-25 MED ORDER — CEFEPIME HCL 1 G IJ SOLR
1.0000 g | Freq: Once | INTRAMUSCULAR | Status: AC
  Administered 2014-12-25: 1 g via INTRAVENOUS
  Filled 2014-12-25: qty 1

## 2014-12-25 MED ORDER — SODIUM CHLORIDE 0.9 % IV SOLN
1000.0000 mL | Freq: Once | INTRAVENOUS | Status: AC
  Administered 2014-12-25: 1000 mL via INTRAVENOUS

## 2014-12-25 MED ORDER — SODIUM CHLORIDE 0.9 % IV SOLN
1000.0000 mL | INTRAVENOUS | Status: DC
  Administered 2014-12-26: 1000 mL via INTRAVENOUS

## 2014-12-25 MED ORDER — CLINDAMYCIN PHOSPHATE 900 MG/50ML IV SOLN
900.0000 mg | Freq: Once | INTRAVENOUS | Status: AC
  Administered 2014-12-25: 900 mg via INTRAVENOUS
  Filled 2014-12-25: qty 50

## 2014-12-25 MED ORDER — VANCOMYCIN HCL IN DEXTROSE 750-5 MG/150ML-% IV SOLN
750.0000 mg | Freq: Two times a day (BID) | INTRAVENOUS | Status: DC
  Administered 2014-12-26: 750 mg via INTRAVENOUS
  Filled 2014-12-25 (×2): qty 150

## 2014-12-25 MED ORDER — VANCOMYCIN HCL IN DEXTROSE 1-5 GM/200ML-% IV SOLN
1000.0000 mg | Freq: Once | INTRAVENOUS | Status: AC
  Administered 2014-12-26: 1000 mg via INTRAVENOUS
  Filled 2014-12-25: qty 200

## 2014-12-25 MED ORDER — SODIUM CHLORIDE 0.9 % IV BOLUS (SEPSIS)
500.0000 mL | Freq: Once | INTRAVENOUS | Status: AC
  Administered 2014-12-25: 500 mL via INTRAVENOUS

## 2014-12-25 NOTE — ED Provider Notes (Signed)
CSN: [id number]     Arrival date & time 12/25/14  1620 History   First MD Initiated Contact with Patient 12/25/14 1721     Chief Complaint  Patient presents with  . Chest Pain     (Consider location/radiation/quality/duration/timing/severity/associated sxs/prior Treatment) HPI 64 year old female with past medical history as below who is a very poor historian who comes [place] of right-sided chest/shoulder pain which has been ongoing for the last several days. Pain is moderate. Worse with movement. She also reports that at baseline she has shortness of breath. She notes she has not been taking any of her medications for the last several months. She has also noticed blood in her urine but is unable to tell me for how long. Patient is also noted to have a rash on her left lower extremity which travels up to her groin and low back. Patient states rash is painful and itches. She has not tried any treatments for this. She denies any abdominal pain, nausea, vomiting, fever, chills, dysuria. She denies any IV drug use, alcohol use. She denies having history of allergies or similar rash in the past. Past Medical History  Diagnosis Date  . Hypertension   . Hypercholesteremia   . Syncope and collapse 11/2011; 03/14/2013  . Shortness of breath     "sometimes lying down" (03/14/2013)  . Blood in the urine     "here lately; q time I go to pee" (03/14/2013)  . Arthritis     "all over" (03/24/2013)   Past Surgical History  Procedure Laterality Date  . Orif wrist fracture  12/04/2011    Procedure: OPEN REDUCTION INTERNAL FIXATION (ORIF) WRIST FRACTURE;  Surgeon: Marybelle Killings;  Location: [place];  Service: Orthopedics;  Laterality: Left;   Family History  Problem Relation Age of Onset  . Congestive Heart Failure Father   . Hypertension Father   . Hyperlipidemia Father   . Diabetes Other   . Heart disease Other   . Prostate cancer Father   . Ovarian cancer Mother   . Hypertension Mother    History    Substance Use Topics  . Smoking status: Never Smoker   . Smokeless tobacco: Never Used  . Alcohol Use: No   OB History    No data available     Review of Systems  Constitutional: Negative for fever and chills.  HENT: Negative for congestion, rhinorrhea and sore throat.   Eyes: Negative for visual disturbance.  Respiratory: Negative for cough, chest tightness, shortness of breath and wheezing.   Cardiovascular: Positive for chest pain. Negative for palpitations and leg swelling.  Gastrointestinal: Negative for nausea, vomiting, abdominal pain, diarrhea and constipation.  Genitourinary: Positive for hematuria. Negative for dysuria, decreased urine volume, vaginal bleeding, vaginal discharge, vaginal pain and pelvic pain.  Musculoskeletal: Negative for back pain and neck pain.  Skin: Positive for rash.  Neurological: Negative for weakness and headaches.  All other systems reviewed and are negative.     Allergies  Review of patient's allergies indicates no known allergies.  Home Medications   Prior to Admission medications   Not on File   BP 131/85 mmHg  Pulse 115  Temp(Src) 98 F (36.7 C) (Oral)  Ht 5\' 8"  (1.727 m)  SpO2 97% Physical Exam  Constitutional: She is oriented to person, place, and time. She appears well-developed and well-nourished. No distress.  HENT:  Head: Normocephalic and atraumatic.  Eyes: Conjunctivae are normal.  Neck: Normal range of motion.  Cardiovascular: Regular rhythm, normal  heart sounds and intact distal pulses.  Tachycardia present.   No murmur heard. Pulmonary/Chest: Effort normal and breath sounds normal. No respiratory distress. She has no wheezes. She has no rales. She exhibits no tenderness.  Abdominal: Soft. Bowel sounds are normal. She exhibits no distension.  Musculoskeletal: Normal range of motion.  Neurological: She is alert and oriented to person, place, and time. No cranial nerve deficit.  Skin: Skin is warm and dry. There is  erythema (BL lower extremities to groin - see pic.).     Rash is warm, red, tender. No open lesions. No induration/fluctuance appreciated. No crepitus.  Psychiatric: She has a normal mood and affect.  Nursing note and vitals reviewed.   ED Course  Procedures (including critical care time) Labs Review Labs Reviewed  CBC - Abnormal; Notable for the following:    Hemoglobin 11.9 (*)    All other components within normal limits  BASIC METABOLIC PANEL - Abnormal; Notable for the following:    Glucose, Bld 105 (*)    Creatinine, Ser 1.13 (*)    GFR calc non Af Amer 50 (*)    GFR calc Af Amer 58 (*)    All other components within normal limits  URINALYSIS, ROUTINE W REFLEX MICROSCOPIC - Abnormal; Notable for the following:    Color, Urine RED (*)    APPearance TURBID (*)    Hgb urine dipstick LARGE (*)    Bilirubin Urine SMALL (*)    Ketones, ur 15 (*)    Protein, ur >300 (*)    Leukocytes, UA MODERATE (*)    All other components within normal limits  CBC WITH DIFFERENTIAL - Abnormal; Notable for the following:    Hemoglobin 11.3 (*)    HCT 34.7 (*)    All other components within normal limits  URINE MICROSCOPIC-ADD ON - Abnormal; Notable for the following:    Bacteria, UA MANY (*)    Crystals TRIPLE PHOSPHATE CRYSTALS (*)    All other components within normal limits  URINE CULTURE  CULTURE, BLOOD (ROUTINE X 2)  CULTURE, BLOOD (ROUTINE X 2)  MRSA PCR SCREENING  I-STAT TROPOININ, ED  I-STAT CG4 LACTIC ACID, ED    Imaging Review Dg Chest 2 View  12/25/2014   CLINICAL DATA:  Chest pain for 1 day, history hypertension  EXAM: CHEST  2 VIEW  COMPARISON:  03/14/2013  FINDINGS: Normal heart size, mediastinal contours, and pulmonary vascularity.  Multiple small nodular density of identified concerning for multiple pulmonary nodules.  Scattered interstitial prominence and suspect underlying emphysematous changes.  No segmental consolidation, pleural effusion or pneumothorax.  Bones  appear demineralized.  IMPRESSION: Emphysematous and chronic interstitial changes with new subtle nodular densities in both lungs suspicious for pulmonary nodules ; further evaluation by CT chest with contrast recommended for further evaluation.   Electronically Signed   By: Lavonia Dana M.D.   On: 12/25/2014 17:00   Ct Head Wo Contrast  12/25/2014   CLINICAL DATA:  Confusion  EXAM: CT HEAD WITHOUT CONTRAST  TECHNIQUE: Contiguous axial images were obtained from the base of the skull through the vertex without intravenous contrast.  COMPARISON:  03/14/2013  FINDINGS: Imaging at the vertex is motion degraded. This could miss a subtle or small finding, but no acute pathology is suspected at this level.  Skull and Sinuses:Negative for fracture or destructive process. The mastoids, middle ears, and imaged paranasal sinuses are clear.  Orbits: No acute abnormality.  Brain: There is no evidence of acute infarct, hemorrhage,  obstructive hydrocephalus, or mass lesion. Mildly increased lateral and third ventriculomegaly, out of proportion to brain atrophy. This pattern has been seen previously and is suggestive of a communicating hydrocephalus. There is chronic small-vessel disease with patchy ischemic gliosis in the bilateral cerebral white matter. There is been a small remote cortical infarct in the high left parietal lobe. Stable retro cerebellar CSF accumulation without mass effect on the fourth ventricle.  IMPRESSION: 1. No acute findings on this degraded study. 2. Mild progression of ventriculomegaly since 2014. 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 3. 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   Electronically Signed   By: Jorje Guild M.D.   On: 12/25/2014 21:34   Ct Angio Chest Pe W/cm &/or Wo Cm  12/25/2014   CLINICAL DATA:  64 year old female with 1 day history of right-sided chest pain.  EXAM: CT ANGIOGRAPHY CHEST WITH CONTRAST  TECHNIQUE: Multidetector CT  imaging of the chest was performed using the standard protocol during bolus administration of intravenous contrast. Multiplanar CT image reconstructions and MIPs were obtained to evaluate the vascular anatomy.  CONTRAST:  144mL OMNIPAQUE IOHEXOL 350 MG/ML SOLN  COMPARISON:  No priors.  FINDINGS: Mediastinum/Lymph Nodes: There are no filling defects within the pulmonary arterial tree to suggest underlying pulmonary embolism. Heart size is borderline enlarged. Trace amount of pericardial fluid and/or thickening, unlikely to be of hemodynamic significance at this time. No associated pericardial calcification. No pathologically enlarged mediastinal or hilar lymph nodes. Esophagus is unremarkable in appearance. No axillary lymphadenopathy.  Lungs/Pleura: There are innumerable pulmonary nodules scattered throughout the lungs bilaterally, many of which are cavitary. These appear randomly distributed, although there is a slight craniocaudal gradient. The largest nodule is in the left upper lobe measuring up to 1.5 x 1.0 cm (image 34 of series 6). No confluent consolidative airspace disease. No pleural effusions.  Musculoskeletal/Soft Tissues: There are no aggressive appearing lytic or blastic lesions noted in the visualized portions of the skeleton.  Upper Abdomen: There are innumerable ill-defined intermediate attenuation lesions scattered throughout the hepatic parenchyma, highly concerning for widespread metastatic disease. The largest of these lesions is in segment 4A (image 63 of series 4) measuring 2.1 x 2.4 cm. Large nodule in the right adrenal gland measuring 2.5 x 1.6 cm.  Review of the MIP images confirms the above findings.  IMPRESSION: 1. No evidence of pulmonary embolism. 2. Findings in the lungs and liver highly concerning for widespread metastatic disease. Given the cavitary nature of many of the pulmonary nodules, a primary squamous cell carcinoma is suspected. It is possible that the cavitary pulmonary  nodules could reflect widespread septic emboli, however, that is not favored, and would not account for the hepatic findings. 3. Right adrenal nodule also concerning for metastatic disease. These results were called by telephone at the time of interpretation on 12/25/2014 at 7:14 pm to Dr. Kirstie Peri, who verbally acknowledged these results.   Electronically Signed   By: Vinnie Langton M.D.   On: 12/25/2014 19:17     EKG Interpretation   Date/Time:  Thursday December 25 2014 16:26:00 EST Ventricular Rate:  123 PR Interval:  120 QRS Duration: 66 QT Interval:  304 QTC Calculation: 435 R Axis:   71 Text Interpretation:  Sinus tachycardia Otherwise normal ECG No  significant change since 2012 except rate is faster Confirmed by WARD,   DO, KRISTEN ([id number]) on 12/25/2014 5:22:06 PM      MDM   Final diagnoses:  Chest pain  Taylor Foley is a 64 y.o. female with H&P as above. Very poor historian who presents with right shoulder pain is noted to also have extensive cellulitis to bilateral lower extremities which she was not aware. Pt tachy, normal BP, AF. EKG shows sinus tach. Patient was treated for her cellulitis with IV clindamycin. She has no signs of respiratory distress. As for patient's right shoulder pain and there are no signs of trauma and her pain is reproducible with palpation. She is neurovascularly intact distally. Labs show ARF. Chest x-ray was performed which shows concerning nodules which would be better evaluated with CT study. A CT of her chest has been ordered and shows no PE but does show widespread metastatic disease as above. CT does show cavitary lesions which could represent septic emboli.  Because of this patient's ABX will be broadened with vancomycin and cefepime. Will admit to hospitalist.  Pt seen in conjunction with Dr. Filiberto Pinks, [facility] Resident - PGY-2     Kirstie Peri, MD 12/26/14 [phone]

## 2014-12-25 NOTE — H&P (Addendum)
[facility] History and Physical  [place] HCW:[id number] DOB: 23-Aug-1951 DOA: 12/25/2014  Referring physician: EDP PCP: PROVIDER NOT IN SYSTEM   Chief Complaint: Chest pain   HPI: Taylor Foley is a 64 y.o. female who presents to the ED with R sided chest and shoulder pain.  Worse with shoulder movement.  Better with rest.  Patient also has baseline SOB, taking no meds for last several months.  Noticed blood in urine, unable to state for how long.  Legs are red, swollen and painful, also unable to tell us for how long.  Patient also suspects that "something may be wrong with my head".  Review of Systems: Systems reviewed.  As above, otherwise negative  Past Medical History  Diagnosis Date  . Hypertension   . Hypercholesteremia   . Syncope and collapse 11/2011; 03/14/2013  . Shortness of breath     "sometimes lying down" (03/14/2013)  . Blood in the urine     "here lately; q time I go to pee" (03/14/2013)  . Arthritis     "all over" (03/24/2013)   Past Surgical History  Procedure Laterality Date  . Orif wrist fracture  12/04/2011    Procedure: OPEN REDUCTION INTERNAL FIXATION (ORIF) WRIST FRACTURE;  Surgeon: Marybelle Killings;  Location: [facility];  Service: Orthopedics;  Laterality: Left;   Social History:  reports that she has never smoked. She has never used smokeless tobacco. She reports that she does not drink alcohol or use illicit drugs.  No Known Allergies  Family History  Problem Relation Age of Onset  . Congestive Heart Failure Father   . Hypertension Father   . Hyperlipidemia Father   . Diabetes Other   . Heart disease Other   . Prostate cancer Father   . Ovarian cancer Mother   . Hypertension Mother      Prior to Admission medications   Not on File   Physical Exam: Filed Vitals:   12/25/14 1945  BP: 146/79  Pulse: 94  Temp:     BP 146/79 mmHg  Pulse 94  Temp(Src) 98.7 F (37.1 C) (Rectal)  Ht 5\' 8"  (1.727 m)  SpO2 100%  General  Appearance:    Alert, oriented, no distress, appears stated age  Head:    Normocephalic, atraumatic  Eyes:    PERRL, EOMI, sclera non-icteric        Nose:   Nares without drainage or epistaxis. Mucosa, turbinates normal  Throat:   Moist mucous membranes. Oropharynx without erythema or exudate.  Neck:   Supple. No carotid bruits.  No thyromegaly.  No lymphadenopathy.   Back:     No CVA tenderness, no spinal tenderness  Lungs:     Clear to auscultation bilaterally, without wheezes, rhonchi or rales  Chest wall:    No tenderness to palpitation  Heart:    RRR, has 3/6 murmur present.  Abdomen:     Soft, non-tender, nondistended, normal bowel sounds, no organomegaly  Genitalia:    deferred  Rectal:    deferred  Extremities:   No clubbing, cyanosis or edema.  No obvious splinter hemorrhages, janeway lesions nor oslow nodes.  Pulses:   2+ and symmetric all extremities  Skin:   BLE erythema, edema, induration around buttocks.  Radiates down her legs.  Mild tenderness, no obvious necrosis, no crepitus.  Lymph nodes:   Cervical, supraclavicular, and axillary nodes normal  Neurologic:   CNII-XII intact. Normal strength, sensation and reflexes      throughout  Labs on Admission:  Basic Metabolic Panel:  Recent Labs Lab 12/25/14 1626  NA 137  K 4.2  CL 103  CO2 26  GLUCOSE 105*  BUN 18  CREATININE 1.13*  CALCIUM 9.0   Liver Function Tests: No results for input(s): AST, ALT, ALKPHOS, BILITOT, PROT, ALBUMIN in the last 168 hours. No results for input(s): LIPASE, AMYLASE in the last 168 hours. No results for input(s): AMMONIA in the last 168 hours. CBC:  Recent Labs Lab 12/25/14 1626 12/25/14 1811  WBC 9.3 8.8  NEUTROABS  --  6.0  HGB 11.9* 11.3*  HCT 36.8 34.7*  MCV 85.0 84.2  PLT 392 345   Cardiac Enzymes: No results for input(s): CKTOTAL, CKMB, CKMBINDEX, TROPONINI in the last 168 hours.  BNP (last 3 results) No results for input(s): PROBNP in the last 8760  hours. CBG: No results for input(s): GLUCAP in the last 168 hours.  Radiological Exams on Admission: Dg Chest 2 View  12/25/2014   CLINICAL DATA:  Chest pain for 1 day, history hypertension  EXAM: CHEST  2 VIEW  COMPARISON:  03/14/2013  FINDINGS: Normal heart size, mediastinal contours, and pulmonary vascularity.  Multiple small nodular density of identified concerning for multiple pulmonary nodules.  Scattered interstitial prominence and suspect underlying emphysematous changes.  No segmental consolidation, pleural effusion or pneumothorax.  Bones appear demineralized.  IMPRESSION: Emphysematous and chronic interstitial changes with new subtle nodular densities in both lungs suspicious for pulmonary nodules ; further evaluation by CT chest with contrast recommended for further evaluation.   Electronically Signed   By: Lavonia Dana M.D.   On: 12/25/2014 17:00   Ct Angio Chest Pe W/cm &/or Wo Cm  12/25/2014   CLINICAL DATA:  64 year old female with 1 day history of right-sided chest pain.  EXAM: CT ANGIOGRAPHY CHEST WITH CONTRAST  TECHNIQUE: Multidetector CT imaging of the chest was performed using the standard protocol during bolus administration of intravenous contrast. Multiplanar CT image reconstructions and MIPs were obtained to evaluate the vascular anatomy.  CONTRAST:  128mL OMNIPAQUE IOHEXOL 350 MG/ML SOLN  COMPARISON:  No priors.  FINDINGS: Mediastinum/Lymph Nodes: There are no filling defects within the pulmonary arterial tree to suggest underlying pulmonary embolism. Heart size is borderline enlarged. Trace amount of pericardial fluid and/or thickening, unlikely to be of hemodynamic significance at this time. No associated pericardial calcification. No pathologically enlarged mediastinal or hilar lymph nodes. Esophagus is unremarkable in appearance. No axillary lymphadenopathy.  Lungs/Pleura: There are innumerable pulmonary nodules scattered throughout the lungs bilaterally, many of which are  cavitary. These appear randomly distributed, although there is a slight craniocaudal gradient. The largest nodule is in the left upper lobe measuring up to 1.5 x 1.0 cm (image 34 of series 6). No confluent consolidative airspace disease. No pleural effusions.  Musculoskeletal/Soft Tissues: There are no aggressive appearing lytic or blastic lesions noted in the visualized portions of the skeleton.  Upper Abdomen: There are innumerable ill-defined intermediate attenuation lesions scattered throughout the hepatic parenchyma, highly concerning for widespread metastatic disease. The largest of these lesions is in segment 4A (image 63 of series 4) measuring 2.1 x 2.4 cm. Large nodule in the right adrenal gland measuring 2.5 x 1.6 cm.  Review of the MIP images confirms the above findings.  IMPRESSION: 1. No evidence of pulmonary embolism. 2. Findings in the lungs and liver highly concerning for widespread metastatic disease. Given the cavitary nature of many of the pulmonary nodules, a primary squamous cell carcinoma is suspected. It  is possible that the cavitary pulmonary nodules could reflect widespread septic emboli, however, that is not favored, and would not account for the hepatic findings. 3. Right adrenal nodule also concerning for metastatic disease. These results were called by telephone at the time of interpretation on 12/25/2014 at 7:14 pm to Dr. Kirstie Peri, who verbally acknowledged these results.   Electronically Signed   By: Vinnie Langton M.D.   On: 12/25/2014 19:17    EKG: Independently reviewed.  Assessment/Plan Active Problems:   Chest pain   Liver lesion   Multiple lung nodules   Bilateral cellulitis of lower leg   Heart murmur    1. Multiple cavitary pulmonary nodules, liver lesions, adrenal lesion, BLE cellulitis, and heart murmur -  DDX at this point is widespread metastatic cancer vs endocarditis with septic emboli. 1. Blood cultures pending 2. On empiric cefepime and vancomycin  in case this is bacteremia / septic emboli 3. Given the findings everywhere else and patients odd behavior (and her statement that she is concerned that "something may be wrong with her head") CT head is pending 4. BLE cellulitis is not acting like a necrotizing fascitis.  Patient is mildly tachycardic but otherwise has no SIRS criteria. 5. Concerned that BLE cellulitis may actually be some form of paraneoplastic syndrome but unclear on this still. 6. 2D echo pending given findings and heart murmur, although a negative study will not rule out endocarditis (would need TEE). 7. Tele monitor 8. If all of the above is not diagnostic, then next step is likely pulmonology consult re: diagnostic bronchoscopy. (unless of course an easier biopsy target is identified during the course of imaging studies).  May also consider CT abd/pelvis to continue the search for a primary lesion for malignancy work up.    Code Status: Full Code  Family Communication: No family in room Disposition Plan: Admit to inpatient   Time spent: 70 min  Lilliona Blakeney M. [facility] Pager [phone]  If 7AM-7PM, please contact the day team taking care of the patient [email or web address] Password [email or web address] 12/25/2014, 8:50 PM

## 2014-12-25 NOTE — ED Notes (Signed)
Dr. Gardener at bedside. 

## 2014-12-25 NOTE — ED Provider Notes (Signed)
I saw and evaluated the patient, reviewed the resident's note and I agree with the findings and plan.   EKG Interpretation   Date/Time:  Thursday December 25 2014 16:26:00 EST Ventricular Rate:  123 PR Interval:  120 QRS Duration: 66 QT Interval:  304 QTC Calculation: 435 R Axis:   71 Text Interpretation:  Sinus tachycardia Otherwise normal ECG No  significant change since 2012 except rate is faster Confirmed by Omar Orrego,   DO, Lynia Landry [phone]) on 12/25/2014 5:22:06 PM      Pt is a 64 y.o. female with history of hypertension, hyperlipidemia who presents to the emergency department with complaints of right-sided chest pain and right shoulder pain for the past several months worse today. States pain is worse with moving her arm. States she was told by her sister that she could be having a heart attack and that is why she came to the emergency department. No history of injury to this shoulder. Range of motion is limited but no obvious bony deformity or sign of dislocation. No joint effusion or erythema or warmth. She does however have bilateral erythema and warmth to her lower extremities without crepitus concerning for cellulitis. No signs of necrotizing fasciitis. Patient is a very poor historian and is difficult to tell how long the symptoms have been present. She is tachycardic in the emergency department.  CT scan shows concerns for diffuse metastatic disease versus septic emboli. She does also appear to have a UTI. She initially received clindamycin for cellulitis but given possibility of septic emboli and UTI we have broaden her coverage with vancomycin and ceftriaxone. Discussed with hospitalist for admission.  [place], DO 12/26/14 [phone]

## 2014-12-25 NOTE — ED Notes (Signed)
Pt reprots 1 hour ago she started having R neck,shoulder, chest pain. sts she can't raise R arm.

## 2014-12-25 NOTE — Progress Notes (Addendum)
ANTIBIOTIC CONSULT NOTE - INITIAL  Pharmacy Consult for Vancomycin & Cefepime Indication: Bacteremia  No Known Allergies  Patient Measurements: Height: 5\' 8"  (172.7 cm) IBW/kg (Calculated) : 63.9   Vital Signs: Temp: 98.7 F (37.1 C) (01/21 1820) Temp Source: Rectal (01/21 1820) BP: 146/79 mmHg (01/21 1945) Pulse Rate: 94 (01/21 1945) Intake/Output from previous day:   Intake/Output from this shift:    Labs:  Recent Labs  12/25/14 1626 12/25/14 1811  WBC 9.3 8.8  HGB 11.9* 11.3*  PLT 392 345  CREATININE 1.13*  --    CrCl cannot be calculated (Unknown ideal weight.). No results for input(s): VANCOTROUGH, VANCOPEAK, VANCORANDOM, GENTTROUGH, GENTPEAK, GENTRANDOM, TOBRATROUGH, TOBRAPEAK, TOBRARND, AMIKACINPEAK, AMIKACINTROU, AMIKACIN in the last 72 hours.   Microbiology: No results found for this or any previous visit (from the past 720 hour(s)).  Medical History: Past Medical History  Diagnosis Date  . Hypertension   . Hypercholesteremia   . Syncope and collapse 11/2011; 03/14/2013  . Shortness of breath     "sometimes lying down" (03/14/2013)  . Blood in the urine     "here lately; q time I go to pee" (03/14/2013)  . Arthritis     "all over" (03/24/2013)    Medications:   (Not in a hospital admission) Scheduled:   Infusions:  . sodium chloride    . ceFEPime (MAXIPIME) IV 1 g (12/25/14 2017)  . vancomycin     Assessment:  61 YOF w/ PMH of HTN, HLD, and arthritis presenting to [facility] on 12/25/14 c/o right sided chest/shoulder pain ongoing for last several days.  Pharmacy has been consulted to dose vancomycin and cefepime for potential bacteremia.  WBC wnl, afebrile, HR 94, RR up 21. 100 % RA.   Scr 1.13, pending weight  Cefepime 1 g IV x 1, Clindamycin 900 mg IV x 1.    Goal of Therapy:  Vancomycin trough level 15-20 mcg/ml  Plan:  - Vancomycin 1 g IV x 1 - Will follow up maintenace dosing when weight obtained - Monitor for clinical efficacy and  trough levels when appropriate - Monitor renal function - Follow up cultures  Hassie Bruce, Pharm. D. Clinical Pharmacy Resident Pager: [phone] Ph: [phone] 12/25/2014 8:59 PM

## 2014-12-26 ENCOUNTER — Inpatient Hospital Stay (HOSPITAL_COMMUNITY): Payer: Medicaid Other

## 2014-12-26 ENCOUNTER — Encounter (HOSPITAL_COMMUNITY): Payer: Self-pay | Admitting: Radiology

## 2014-12-26 DIAGNOSIS — R011 Cardiac murmur, unspecified: Secondary | ICD-10-CM

## 2014-12-26 DIAGNOSIS — R918 Other nonspecific abnormal finding of lung field: Secondary | ICD-10-CM

## 2014-12-26 DIAGNOSIS — K7689 Other specified diseases of liver: Secondary | ICD-10-CM

## 2014-12-26 DIAGNOSIS — R079 Chest pain, unspecified: Secondary | ICD-10-CM

## 2014-12-26 LAB — COMPREHENSIVE METABOLIC PANEL
ALBUMIN: 2.4 g/dL — AB (ref 3.5–5.2)
ALT: 19 U/L (ref 0–35)
AST: 25 U/L (ref 0–37)
Alkaline Phosphatase: 57 U/L (ref 39–117)
Anion gap: 6 (ref 5–15)
BUN: 14 mg/dL (ref 6–23)
CALCIUM: 8.1 mg/dL — AB (ref 8.4–10.5)
CHLORIDE: 109 meq/L (ref 96–112)
CO2: 24 mmol/L (ref 19–32)
Creatinine, Ser: 1 mg/dL (ref 0.50–1.10)
GFR calc Af Amer: 67 mL/min — ABNORMAL LOW (ref >90)
GFR calc non Af Amer: 58 mL/min — ABNORMAL LOW (ref >90)
Glucose, Bld: 92 mg/dL (ref 70–99)
POTASSIUM: 4.1 mmol/L (ref 3.5–5.1)
Sodium: 139 mmol/L (ref 135–145)
TOTAL PROTEIN: 5.1 g/dL — AB (ref 6.0–8.3)
Total Bilirubin: 0.5 mg/dL (ref 0.3–1.2)

## 2014-12-26 LAB — URINE CULTURE

## 2014-12-26 LAB — MRSA PCR SCREENING: MRSA by PCR: NEGATIVE

## 2014-12-26 MED ORDER — OXYCODONE HCL 5 MG PO TABS
5.0000 mg | ORAL_TABLET | Freq: Four times a day (QID) | ORAL | Status: DC | PRN
  Administered 2014-12-26: 5 mg via ORAL
  Filled 2014-12-26: qty 1

## 2014-12-26 MED ORDER — CEFTRIAXONE SODIUM 1 G IJ SOLR
1.0000 g | INTRAMUSCULAR | Status: DC
  Administered 2014-12-26 – 2014-12-27 (×2): 1 g via INTRAMUSCULAR
  Filled 2014-12-26 (×4): qty 10

## 2014-12-26 MED ORDER — IOHEXOL 300 MG/ML  SOLN
25.0000 mL | INTRAMUSCULAR | Status: AC
  Administered 2014-12-26 (×2): 25 mL via ORAL

## 2014-12-26 MED ORDER — HEPARIN SODIUM (PORCINE) 5000 UNIT/ML IJ SOLN
5000.0000 [IU] | Freq: Three times a day (TID) | INTRAMUSCULAR | Status: DC
  Administered 2014-12-26 – 2014-12-28 (×3): 5000 [IU] via SUBCUTANEOUS
  Filled 2014-12-26 (×7): qty 1

## 2014-12-26 MED ORDER — LORAZEPAM 2 MG/ML IJ SOLN
1.0000 mg | INTRAMUSCULAR | Status: DC | PRN
  Administered 2014-12-26 – 2014-12-27 (×2): 1 mg via INTRAMUSCULAR
  Filled 2014-12-26 (×3): qty 1

## 2014-12-26 MED ORDER — CEFTRIAXONE SODIUM IN DEXTROSE 20 MG/ML IV SOLN
1.0000 g | INTRAVENOUS | Status: DC
  Filled 2014-12-26: qty 50

## 2014-12-26 MED ORDER — HALOPERIDOL LACTATE 5 MG/ML IJ SOLN
5.0000 mg | Freq: Once | INTRAMUSCULAR | Status: AC
  Administered 2014-12-26: 5 mg via INTRAMUSCULAR
  Filled 2014-12-26: qty 1

## 2014-12-26 MED ORDER — IOHEXOL 300 MG/ML  SOLN
100.0000 mL | Freq: Once | INTRAMUSCULAR | Status: AC | PRN
  Administered 2014-12-26: 100 mL via INTRAVENOUS

## 2014-12-26 NOTE — Progress Notes (Signed)
Patient exhibiting unsafe behavior and poor safety awareness with clear signs of psychosis. Currently agitated, perseverant, paranoid and hypervigilant concerning personal belongings that are safely stored and lights outside the window with blinds closed.  Has recently pulled out new IV with antibiotic infusing and getting OOB unassisted frequently. Refusing medications and personal assistance.  Bed alarm in use and nursing staff on-hand to assist patient in rapid order.  Contacted Walden Field, NP on-call for [facility] to request psychiatric consultation and other orders.  Orders received for haloperidol 5mg  IM x 1 dose and lorazepam 1mg  IM q4hrs PRN for anxiety/agitation.  Safety sitter requested per staffing office and notified Administrative Coordinator of need.  Will give haloperidol 5mg  IM per order and continue to monitor closely.

## 2014-12-26 NOTE — Progress Notes (Signed)
  Echocardiogram 2D Echocardiogram has been performed.  Taylor Foley 12/26/2014, 10:56 AM

## 2014-12-26 NOTE — Progress Notes (Signed)
UR Completed Ayodele Hartsock Graves-Bigelow, RN,BSN [phone]  

## 2014-12-26 NOTE — Consult Note (Signed)
Name: Taylor Foley MRN: [id number] DOB: [birth year]    ADMISSION DATE:  12/25/2014 CONSULTATION DATE:  1/21  REFERRING MD :  Jerilee Hoh  CHIEF COMPLAINT:  Pulmonary nodules   BRIEF PATIENT DESCRIPTION:  64 year old female who presented to the ER on 1/21 w/CC; right sided chest and shoulder pain seemingly exacerbated by deep breath. CT was obtained of chest which was obtained to r/o PE. This was negative but did identify multiple pulmonary nodules, w/ innumerable scattered lesions on the hepatic parenchyma and a large nodule on the right adrenal gland. PCCM was asked to see given concern about malignancy.   SIGNIFICANT EVENTS   STUDIES:  1/21 CT chest: no PE, multiple cavitary pulmonary nodules, right adrenal nodule. There are innumerable ill-defined intermediate attenuation lesions scattered throughout the hepatic parenchyma, The largest of these lesions is in segment  measuring 2.1x 2.4 cm. Large nodule in the right adrenal gland measuring 1.6 cm. 1/21 CT head: neg for acute finding   HISTORY OF PRESENT ILLNESS:   64 year old female who presented to the ER on 1/21 w/CC; right sided chest and shoulder pain seemingly exacerbated by deep breath, also had LE swelling and discomfort. First noted ~ 1-2 d prior to admit. CT was obtained of chest which was obtained to r/o PE. This was negative but did identify multiple pulmonary nodules, w/ innumerable scattered lesions on the hepatic parenchyma and a large nodule on the right adrenal gland. PCCM was asked to see given concern about malignancy.     PAST MEDICAL HISTORY :   has a past medical history of Hypertension; Hypercholesteremia; Syncope and collapse (11/2011; 03/14/2013); Shortness of breath; Blood in the urine; and Arthritis.  has past surgical history that includes ORIF wrist fracture (12/04/2011). Prior to Admission medications   Not on File   No Known Allergies  FAMILY HISTORY:  family history includes Congestive Heart  Failure in her father; Diabetes in her other; Heart disease in her other; Hyperlipidemia in her father; Hypertension in her father and mother; Ovarian cancer in her mother; Prostate cancer in her father. SOCIAL HISTORY:  reports that she has never smoked. She has never used smokeless tobacco. She reports that she does not drink alcohol or use illicit drugs.  REVIEW OF SYSTEMS (bolds are positive):   Constitutional: Negative for fever, chills, intentional weight loss, malaise/fatigue and diaphoresis.  HENT: Negative for hearing loss, ear pain, nosebleeds, congestion, sore throat, neck pain, radiated from right chest w/ deep breath  tinnitus and ear discharge.   Eyes: Negative for blurred vision, double vision, photophobia, pain, discharge and redness.  Respiratory: Negative for cough, hemoptysis, sputum production, shortness of breath, wheezing and stridor.   Cardiovascular: Negative for chest pain on right  palpitations, orthopnea, claudication, leg swelling and PND.  Gastrointestinal: Negative for heartburn, nausea, vomiting, abdominal pain, diarrhea, constipation, blood in stool and melena.  Genitourinary: Negative for dysuria, urgency, frequency, hematuria and flank pain.  Musculoskeletal: Negative for myalgias, back pain, joint pain and falls.  Skin: Negative for itching and rash.  Neurological: Negative for dizziness, tingling, tremors, sensory change, speech change, focal weakness, seizures, loss of consciousness, weakness and headaches.  Endo/Heme/Allergies: Negative for environmental allergies and polydipsia. Does not bruise/bleed easily.  SUBJECTIVE:  Affect strange  VITAL SIGNS: Temp:  [97.2 F (36.2 C)-99.6 F (37.6 C)] 98.4 F (36.9 C) (01/22 1212) Pulse Rate:  [81-115] 81 (01/22 0423) Resp:  [19-22] 19 (01/22 1212) BP: (108-161)/(48-85) 108/48 mmHg (01/22 1212) SpO2:  [91 %-100 %]  100 % (01/22 1212) Weight:  [73.988 kg (163 lb 1.8 oz)-74.481 kg (164 lb 3.2 oz)] 73.988 kg (163  lb 1.8 oz) (01/22 0423)  PHYSICAL EXAMINATION: General:  Loud, affect inconsistent w/ situation  Neuro: awake, oriented.  HEENT:  Edentulous, no JVD  Cardiovascular:  rrr Lungs:  Clear  Abdomen:  Soft non-tender  Musculoskeletal:  Intact  Skin:  Intact    Recent Labs Lab 12/25/14 1626 12/26/14 0830  NA 137 139  K 4.2 4.1  CL 103 109  CO2 26 24  BUN 18 14  CREATININE 1.13* 1.00  GLUCOSE 105* 92    Recent Labs Lab 12/25/14 1626 12/25/14 1811  HGB 11.9* 11.3*  HCT 36.8 34.7*  WBC 9.3 8.8  PLT 392 345   Dg Chest 2 View  12/25/2014   CLINICAL DATA:  Chest pain for 1 day, history hypertension  EXAM: CHEST  2 VIEW  COMPARISON:  03/14/2013  FINDINGS: Normal heart size, mediastinal contours, and pulmonary vascularity.  Multiple small nodular density of identified concerning for multiple pulmonary nodules.  Scattered interstitial prominence and suspect underlying emphysematous changes.  No segmental consolidation, pleural effusion or pneumothorax.  Bones appear demineralized.  IMPRESSION: Emphysematous and chronic interstitial changes with new subtle nodular densities in both lungs suspicious for pulmonary nodules ; further evaluation by CT chest with contrast recommended for further evaluation.   Electronically Signed   By: Lavonia Dana M.D.   On: 12/25/2014 17:00   Ct Head Wo Contrast  12/25/2014   CLINICAL DATA:  Confusion  EXAM: CT HEAD WITHOUT CONTRAST  TECHNIQUE: Contiguous axial images were obtained from the base of the skull through the vertex without intravenous contrast.  COMPARISON:  03/14/2013  FINDINGS: Imaging at the vertex is motion degraded. This could miss a subtle or small finding, but no acute pathology is suspected at this level.  Skull and Sinuses:Negative for fracture or destructive process. The mastoids, middle ears, and imaged paranasal sinuses are clear.  Orbits: No acute abnormality.  Brain: There is no evidence of acute infarct, hemorrhage, obstructive  hydrocephalus, or mass lesion. Mildly increased lateral and third ventriculomegaly, out of proportion to brain atrophy. This pattern has been seen previously and is suggestive of a communicating hydrocephalus. There is chronic small-vessel disease with patchy ischemic gliosis in the bilateral cerebral white matter. There is been a small remote cortical infarct in the high left parietal lobe. Stable retro cerebellar CSF accumulation without mass effect on the fourth ventricle.  IMPRESSION: 1. No acute findings on this degraded study. 2. Mild progression of ventriculomegaly since 2014. 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 3. 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   Electronically Signed   By: Jorje Guild M.D.   On: 12/25/2014 21:34   Ct Angio Chest Pe W/cm &/or Wo Cm  12/25/2014   CLINICAL DATA:  64 year old female with 1 day history of right-sided chest pain.  EXAM: CT ANGIOGRAPHY CHEST WITH CONTRAST  TECHNIQUE: Multidetector CT imaging of the chest was performed using the standard protocol during bolus administration of intravenous contrast. Multiplanar CT image reconstructions and MIPs were obtained to evaluate the vascular anatomy.  CONTRAST:  121mL OMNIPAQUE IOHEXOL 350 MG/ML SOLN  COMPARISON:  No priors.  FINDINGS: Mediastinum/Lymph Nodes: There are no filling defects within the pulmonary arterial tree to suggest underlying pulmonary embolism. Heart size is borderline enlarged. Trace amount of pericardial fluid and/or thickening, unlikely to be of hemodynamic significance at this time. No associated pericardial calcification. No pathologically  enlarged mediastinal or hilar lymph nodes. Esophagus is unremarkable in appearance. No axillary lymphadenopathy.  Lungs/Pleura: There are innumerable pulmonary nodules scattered throughout the lungs bilaterally, many of which are cavitary. These appear randomly distributed, although there is a slight craniocaudal  gradient. The largest nodule is in the left upper lobe measuring up to 1.5 x 1.0 cm (image 34 of series 6). No confluent consolidative airspace disease. No pleural effusions.  Musculoskeletal/Soft Tissues: There are no aggressive appearing lytic or blastic lesions noted in the visualized portions of the skeleton.  Upper Abdomen: There are innumerable ill-defined intermediate attenuation lesions scattered throughout the hepatic parenchyma, highly concerning for widespread metastatic disease. The largest of these lesions is in segment 4A (image 63 of series 4) measuring 2.1 x 2.4 cm. Large nodule in the right adrenal gland measuring 2.5 x 1.6 cm.  Review of the MIP images confirms the above findings.  IMPRESSION: 1. No evidence of pulmonary embolism. 2. Findings in the lungs and liver highly concerning for widespread metastatic disease. Given the cavitary nature of many of the pulmonary nodules, a primary squamous cell carcinoma is suspected. It is possible that the cavitary pulmonary nodules could reflect widespread septic emboli, however, that is not favored, and would not account for the hepatic findings. 3. Right adrenal nodule also concerning for metastatic disease. These results were called by telephone at the time of interpretation on 12/25/2014 at 7:14 pm to Dr. Kirstie Peri, who verbally acknowledged these results.   Electronically Signed   By: Vinnie Langton M.D.   On: 12/25/2014 19:17    ASSESSMENT / PLAN: Multiple Pulmonary nodules w/ what appears to be associated hepatic lesions and right adrenal nodule. Diff dx would be malignancy vs infectious, although nothing in her history would suggest an infection and a malignancy would unify the widespread findings on CT imaging; so favor malignant process.   Plan Think best approach would be have bx the right adrenal nodule. We will have radiology make  super-D images it may be amendable to navigational bronch if this is not an option but would rather  this be back-up plan.  Call us if we can be of further assist.   UTI   Plan Per primary service  Marni Griffon ACNP  12/26/2014, 2:51 PM  Attending:  I have seen and examined the patient with nurse practitioner/resident and agree with the note above.   History taking is difficult here as there appears to be an underlying mental illness of some sort.  I can't find a clear source of malignancy on physical exam but she does not appear infected at this point.  I agree with Dr. Jonnie Kind assessment that this seems like a malignant process.  Considering the right shoulder pain > could this be referred from an obstructive gall bladder process?  Agree with CT abdomen to evaluate further.    Reviewed the images from the CT chest.  The largest nodule is still fairly small and there is no clear airway leading to it so it would be difficult to biopsy.  Would favor going after the adrenal mass or liver masses, would discuss with IR after CT abdomen.  At this point will sign off.  If malignancy work up negative please call us back and we can consider bronchoscopy.  Roselie Awkward, MD [facility] Pager: [phone] Cell: [phone] If no response, call [phone]

## 2014-12-26 NOTE — Clinical Social Work Psychosocial (Signed)
Clinical Social Work Department BRIEF PSYCHOSOCIAL ASSESSMENT 12/26/2014  Patient:  Taylor Foley, Taylor Foley     Account Number:  [id number]     Admit date:  12/25/2014  Clinical Social Worker:  Marciano Sequin  Date/Time:  12/26/2014 04:47 PM  Referred by:  RN  Date Referred:  12/26/2014 Referred for  SNF Placement   Other Referral:   Interview type:  Patient Other interview type:    PSYCHOSOCIAL DATA Living Status:  ALONE Admitted from facility:   Level of care:   Primary support name:  Alitza, [place] Primary support relationship to patient:  CHILD, ADULT Degree of support available:   Fair Support    CURRENT CONCERNS Current Concerns  None Noted   Other Concerns:    SOCIAL WORK ASSESSMENT / PLAN CSW met the pt at the bedside. CSW introduced self and purpose of visit. CSW and the pt discussed self-care and discharge disposition. The pt reported living by herself. The pt denied self-neglect. The pt reported she is an adult and she has the right to make her own decisions even if other disagree.  Pt reported that she will be transition home after she is discharge. CSW provided the pt with contact information for further questions. CSW will continue to follow this pt and assist with discharge as needed.   Assessment/plan status:  Psychosocial Support/Ongoing Assessment of Needs Other assessment/ plan:  Case manager will follow up regarding medication assist.  Information/referral to community resources:    PATIENT'S/FAMILY'S RESPONSE TO PLAN OF CARE: Pt presented with calm mood and affect. Pt expressed knowing that others may worry about her life choices. Pt placed  emphasis on being an adult and going home.   [place], MSW, [place]

## 2014-12-26 NOTE — Care Management Note (Signed)
    Page 1 of 1   12/29/2014     11:53:54 AM CARE MANAGEMENT NOTE 12/29/2014  Patient:  DEJAI, SCHUBACH   Account Number:  [id number]  Date Initiated:  12/26/2014  Documentation initiated by:  GRAVES-BIGELOW,Shadae Reino  Subjective/Objective Assessment:   Pt admitted for cp, lower ext cellulitis and UTI symptoms. Pt is from home alone and sister lives next door and does not provide much help. Pt is without PCP, no transportation and no home phone.     Action/Plan:   CM did provide pt with information to the [facility]. CSW was notified of social issues. Pt may need placement. PT/OT to evaluate. Will continue to monitor.   Anticipated DC Date:  12/28/2014   Anticipated DC Plan:  SKILLED NURSING FACILITY  In-house referral  Clinical Social Research scientist (physical sciences)      DC Planning Services  CM consult      Choice offered to / List presented to:             Status of service:  Completed, signed off Medicare Important Message given?  NO (If response is "NO", the following Medicare IM given date fields will be blank) Date Medicare IM given:   Medicare IM given by:   Date Additional Medicare IM given:   Additional Medicare IM given by:    Discharge Disposition:  [place]  Per UR Regulation:  Reviewed for med. necessity/level of care/duration of stay  If discussed at [place] of Stay Meetings, dates discussed:   12/30/2014    Comments:  12-29-14 [address], RN,BSN [phone] CM discussed with pt disposition options and the plan is for SNF via [facility]. Plan for d/c in am. No further needs from CM at this time.

## 2014-12-26 NOTE — Progress Notes (Signed)
Pt removed IV access by accident.  Attempted to restart access and pt refused.  Dr. Jerilee Hoh notified,  Will attempt again in 30 minutes

## 2014-12-26 NOTE — Progress Notes (Signed)
Pt refusing IV and tele. Pt explained the importance of both. Np on call made aware. Will cont to monitor pt.

## 2014-12-26 NOTE — Progress Notes (Signed)
[facility] PROGRESS NOTE  Jaala Bohle DGU:[id number] DOB: March 31, 1951 DOA: 12/25/2014 PCP: PROVIDER NOT IN SYSTEM  Assessment/Plan: Right-Sided Chest and Shoulder Pain -Doubt ACS; first troponin negative, no acute ischemic changes. -Suspect may be from what appears to be metastatic cancer, still of unknown primary. -Pain meds PRN.  Metastatic Cancer, Likely, new diagnosis -Multiple pulmonary, hepatic and adrenal nodules. -Will obtain CT abd/pelvis, looking for primary. -Will ask pulmonary to evaluate CT chest to see if biopsy via bronchoscopy is feasible.  UTI -Cx data pending. -Will de-escalate antibiotics to rocephin pending culture data.  ? Cellulitis of the Lower Extremities -No redness evident on exam today.  ?Murmur -No murmur on cardiac auscultation today. -ECHO does not reveal any valvular abnormalities.  Social Issues -Patient appears unkempt and has not had medical care. -SW consult requested. -May need placement prior to DC.  Code Status: Full Code Family Communication: Patient only  Disposition Plan: to be determined   Consultants:  None   Antibiotics:  Rocephin   Subjective: C/o right shoulder and chest pain  Objective: Filed Vitals:   12/25/14 2201 12/26/14 0423 12/26/14 0725 12/26/14 1212  BP: 161/80 132/65 132/67 108/48  Pulse: 99 81    Temp: 97.2 F (36.2 C) 98.1 F (36.7 C) 99.6 F (37.6 C) 98.4 F (36.9 C)  TempSrc: Oral Oral Oral Oral  Resp: 20 20 20 19   Height: 5\' 6"  (1.676 m)     Weight: 74.481 kg (164 lb 3.2 oz) 73.988 kg (163 lb 1.8 oz)    SpO2: 100% 95% 95% 100%    Intake/Output Summary (Last 24 hours) at 12/26/14 1429 Last data filed at 12/25/14 2201  Gross per 24 hour  Intake      0 ml  Output    100 ml  Net   -100 ml   Filed Weights   12/25/14 2201 12/26/14 0423  Weight: 74.481 kg (164 lb 3.2 oz) 73.988 kg (163 lb 1.8 oz)    Exam:   General:  AA Ox3  Cardiovascular: RRR  Respiratory: CTA  B  Abdomen: S/NT/ND/+BS  Extremities: 1-2+ edema bilaterally   Neurologic:  Non-focal  Data Reviewed: Basic Metabolic Panel:  Recent Labs Lab 12/25/14 1626 12/26/14 0830  NA 137 139  K 4.2 4.1  CL 103 109  CO2 26 24  GLUCOSE 105* 92  BUN 18 14  CREATININE 1.13* 1.00  CALCIUM 9.0 8.1*   Liver Function Tests:  Recent Labs Lab 12/26/14 0830  AST 25  ALT 19  ALKPHOS 57  BILITOT 0.5  PROT 5.1*  ALBUMIN 2.4*   No results for input(s): LIPASE, AMYLASE in the last 168 hours. No results for input(s): AMMONIA in the last 168 hours. CBC:  Recent Labs Lab 12/25/14 1626 12/25/14 1811  WBC 9.3 8.8  NEUTROABS  --  6.0  HGB 11.9* 11.3*  HCT 36.8 34.7*  MCV 85.0 84.2  PLT 392 345   Cardiac Enzymes: No results for input(s): CKTOTAL, CKMB, CKMBINDEX, TROPONINI in the last 168 hours. BNP (last 3 results) No results for input(s): PROBNP in the last 8760 hours. CBG: No results for input(s): GLUCAP in the last 168 hours.  Recent Results (from the past 240 hour(s))  MRSA PCR Screening     Status: None   Collection Time: 12/25/14 10:30 PM  Result Value Ref Range Status   MRSA by PCR NEGATIVE NEGATIVE Final    Comment:        The GeneXpert MRSA Assay (FDA approved for  NASAL specimens only), is one component of a comprehensive MRSA colonization surveillance program. It is not intended to diagnose MRSA infection nor to guide or monitor treatment for MRSA infections.      Studies: Dg Chest 2 View  12/25/2014   CLINICAL DATA:  Chest pain for 1 day, history hypertension  EXAM: CHEST  2 VIEW  COMPARISON:  03/14/2013  FINDINGS: Normal heart size, mediastinal contours, and pulmonary vascularity.  Multiple small nodular density of identified concerning for multiple pulmonary nodules.  Scattered interstitial prominence and suspect underlying emphysematous changes.  No segmental consolidation, pleural effusion or pneumothorax.  Bones appear demineralized.  IMPRESSION:  Emphysematous and chronic interstitial changes with new subtle nodular densities in both lungs suspicious for pulmonary nodules ; further evaluation by CT chest with contrast recommended for further evaluation.   Electronically Signed   By: Lavonia Dana M.D.   On: 12/25/2014 17:00   Ct Head Wo Contrast  12/25/2014   CLINICAL DATA:  Confusion  EXAM: CT HEAD WITHOUT CONTRAST  TECHNIQUE: Contiguous axial images were obtained from the base of the skull through the vertex without intravenous contrast.  COMPARISON:  03/14/2013  FINDINGS: Imaging at the vertex is motion degraded. This could miss a subtle or small finding, but no acute pathology is suspected at this level.  Skull and Sinuses:Negative for fracture or destructive process. The mastoids, middle ears, and imaged paranasal sinuses are clear.  Orbits: No acute abnormality.  Brain: There is no evidence of acute infarct, hemorrhage, obstructive hydrocephalus, or mass lesion. Mildly increased lateral and third ventriculomegaly, out of proportion to brain atrophy. This pattern has been seen previously and is suggestive of a communicating hydrocephalus. There is chronic small-vessel disease with patchy ischemic gliosis in the bilateral cerebral white matter. There is been a small remote cortical infarct in the high left parietal lobe. Stable retro cerebellar CSF accumulation without mass effect on the fourth ventricle.  IMPRESSION: 1. No acute findings on this degraded study. 2. Mild progression of ventriculomegaly since 2014. 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 3. 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   Electronically Signed   By: Jorje Guild M.D.   On: 12/25/2014 21:34   Ct Angio Chest Pe W/cm &/or Wo Cm  12/25/2014   CLINICAL DATA:  64 year old female with 1 day history of right-sided chest pain.  EXAM: CT ANGIOGRAPHY CHEST WITH CONTRAST  TECHNIQUE: Multidetector CT imaging of the chest was performed  using the standard protocol during bolus administration of intravenous contrast. Multiplanar CT image reconstructions and MIPs were obtained to evaluate the vascular anatomy.  CONTRAST:  171mL OMNIPAQUE IOHEXOL 350 MG/ML SOLN  COMPARISON:  No priors.  FINDINGS: Mediastinum/Lymph Nodes: There are no filling defects within the pulmonary arterial tree to suggest underlying pulmonary embolism. Heart size is borderline enlarged. Trace amount of pericardial fluid and/or thickening, unlikely to be of hemodynamic significance at this time. No associated pericardial calcification. No pathologically enlarged mediastinal or hilar lymph nodes. Esophagus is unremarkable in appearance. No axillary lymphadenopathy.  Lungs/Pleura: There are innumerable pulmonary nodules scattered throughout the lungs bilaterally, many of which are cavitary. These appear randomly distributed, although there is a slight craniocaudal gradient. The largest nodule is in the left upper lobe measuring up to 1.5 x 1.0 cm (image 34 of series 6). No confluent consolidative airspace disease. No pleural effusions.  Musculoskeletal/Soft Tissues: There are no aggressive appearing lytic or blastic lesions noted in the visualized portions of the skeleton.  Upper Abdomen: There  are innumerable ill-defined intermediate attenuation lesions scattered throughout the hepatic parenchyma, highly concerning for widespread metastatic disease. The largest of these lesions is in segment 4A (image 63 of series 4) measuring 2.1 x 2.4 cm. Large nodule in the right adrenal gland measuring 2.5 x 1.6 cm.  Review of the MIP images confirms the above findings.  IMPRESSION: 1. No evidence of pulmonary embolism. 2. Findings in the lungs and liver highly concerning for widespread metastatic disease. Given the cavitary nature of many of the pulmonary nodules, a primary squamous cell carcinoma is suspected. It is possible that the cavitary pulmonary nodules could reflect widespread septic  emboli, however, that is not favored, and would not account for the hepatic findings. 3. Right adrenal nodule also concerning for metastatic disease. These results were called by telephone at the time of interpretation on 12/25/2014 at 7:14 pm to Dr. Kirstie Peri, who verbally acknowledged these results.   Electronically Signed   By: Vinnie Langton M.D.   On: 12/25/2014 19:17    Scheduled Meds: . ceFEPime (MAXIPIME) IV  1 g Intravenous 3 times per day  . vancomycin  750 mg Intravenous Q12H   Continuous Infusions: . sodium chloride 1,000 mL (12/26/14 0321)    Active Problems:   Chest pain   Liver lesion   Multiple lung nodules   Bilateral cellulitis of lower leg   Heart murmur    Time spent: 40 minutes. Greater than 50% of this time was spent in direct contact with the patient coordinating care.    Lelon Frohlich  [facility] Pager [phone]  If 7PM-7AM, please contact night-coverage at [email or web address], password [facility] 12/26/2014, 2:29 PM  LOS: 1 day

## 2014-12-26 NOTE — Evaluation (Signed)
Occupational Therapy Evaluation Patient Details Name: Taylor Foley MRN: [id number] DOB: [birth year] Today's Date: 12/26/2014    History of Present Illness Taylor Foley is a 64 y.o. female who presents to the ED with R sided chest and shoulder pain.  Worse with shoulder movement.  Better with rest.  Patient also has baseline SOB, taking no meds for last several months.  Noticed blood in urine, unable to state for how long.  Legs are red, swollen and painful, also unable to tell us for how long.  Patient also suspects that "something may be wrong with my head".MRI negative fo rany new events. +UTI, HTN   Clinical Impression   This 64 yo female admitted with above presents to acute OT with increased pain, decreased AROM right shoulder due to this, decreased cognition, decreased balance, decreased mobility all affecting her ability to care for self at an independent level at home. She would benefit from being at an ALF.    Follow Up Recommendations  No OT follow up          Precautions / Restrictions Precautions Precautions: Fall Restrictions Weight Bearing Restrictions: No      Mobility Bed Mobility Overal bed mobility: Independent                Transfers Overall transfer level: Needs assistance   Transfers: Sit to/from Stand Sit to Stand: Min guard                   ADL Overall ADL's : Needs assistance/impaired Eating/Feeding: Independent;Sitting   Grooming: Supervision/safety;Set up;Standing   Upper Body Bathing: Supervision/ safety;Set up;Sitting;Standing   Lower Body Bathing: Set up;Supervison/ safety;Sit to/from stand   Upper Body Dressing : Minimal assistance;Sitting   Lower Body Dressing: Minimal assistance;Sit to/from stand   Toilet Transfer: Min guard;Ambulation;BSC   Toileting- Water quality scientist and Hygiene: Min guard;Sit to/from stand                         Pertinent Vitals/Pain Pain Assessment: 0-10 Pain Score:  10-Worst pain ever Pain Location: right shoulder Pain Descriptors / Indicators: Aching;Sore Pain Intervention(s): Monitored during session     Hand Dominance Right   Extremity/Trunk Assessment Upper Extremity Assessment Upper Extremity Assessment: RUE deficits/detail RUE Deficits / Details: Decreasd AROM/PROM at shoulder due to pain RUE Coordination: decreased gross motor           Communication Communication Communication: No difficulties   Cognition Arousal/Alertness: Awake/alert Behavior During Therapy: WFL for tasks assessed/performed Overall Cognitive Status: Within Functional Limits for tasks assessed (however her self care is on the low end of the specturm)                                Home Living Family/patient expects to be discharged to:: Private residence Living Arrangements: Alone                           Home Equipment: None          Prior Functioning/Environment Level of Independence: Independent        Comments: Walks everywhere-"my feet are my care", opens a can ([employer]/123s and eats them cold for meals, sponge baths    OT Diagnosis: Generalized weakness;Cognitive deficits   OT Problem List: Decreased range of motion;Impaired balance (sitting and/or standing);Pain   OT Treatment/Interventions: Self-care/ADL training;Balance training;Patient/family education;Therapeutic exercise  OT Goals(Current goals can be found in the care plan section) Acute Rehab OT Goals Patient Stated Goal: to cancel her insurance appointment on Monday OT Goal Formulation: With patient Time For Goal Achievement: 01/02/15 Potential to Achieve Goals: Good  OT Frequency: Min 2X/week              End of Session Nurse Communication:  (Pt up in recliner)  Activity Tolerance: Patient tolerated treatment well Patient left: in chair;with call bell/phone within reach;with chair alarm set   Time: 3825-0539 OT Time Calculation (min): 31  min Charges:  OT General Charges $OT Visit: 1 Procedure OT Evaluation $Initial OT Evaluation Tier I: 1 Procedure OT Treatments $Self Care/Home Management : 8-22 mins  Almon Register [phone] 12/26/2014, 3:11 PM

## 2014-12-26 NOTE — Progress Notes (Signed)
Patient resting comfortably with no active complaints at this time.  Will continue to observe closely.

## 2014-12-27 ENCOUNTER — Inpatient Hospital Stay (HOSPITAL_COMMUNITY): Payer: Medicaid Other

## 2014-12-27 LAB — CBC
HCT: 30.2 % — ABNORMAL LOW (ref 36.0–46.0)
Hemoglobin: 10 g/dL — ABNORMAL LOW (ref 12.0–15.0)
MCH: 28 pg (ref 26.0–34.0)
MCHC: 33.1 g/dL (ref 30.0–36.0)
MCV: 84.6 fL (ref 78.0–100.0)
PLATELETS: 252 10*3/uL (ref 150–400)
RBC: 3.57 MIL/uL — ABNORMAL LOW (ref 3.87–5.11)
RDW: 13.5 % (ref 11.5–15.5)
WBC: 6.7 10*3/uL (ref 4.0–10.5)

## 2014-12-27 LAB — COMPREHENSIVE METABOLIC PANEL
ALBUMIN: 2.4 g/dL — AB (ref 3.5–5.2)
ALK PHOS: 60 U/L (ref 39–117)
ALT: 22 U/L (ref 0–35)
AST: 33 U/L (ref 0–37)
Anion gap: 8 (ref 5–15)
BILIRUBIN TOTAL: 0.3 mg/dL (ref 0.3–1.2)
BUN: 12 mg/dL (ref 6–23)
CALCIUM: 8.2 mg/dL — AB (ref 8.4–10.5)
CHLORIDE: 107 mmol/L (ref 96–112)
CO2: 22 mmol/L (ref 19–32)
Creatinine, Ser: 0.95 mg/dL (ref 0.50–1.10)
GFR calc Af Amer: 72 mL/min — ABNORMAL LOW (ref >90)
GFR, EST NON AFRICAN AMERICAN: 62 mL/min — AB (ref >90)
Glucose, Bld: 94 mg/dL (ref 70–99)
Potassium: 4.1 mmol/L (ref 3.5–5.1)
Sodium: 137 mmol/L (ref 135–145)
Total Protein: 5.2 g/dL — ABNORMAL LOW (ref 6.0–8.3)

## 2014-12-27 MED ORDER — HALOPERIDOL LACTATE 5 MG/ML IJ SOLN
5.0000 mg | Freq: Once | INTRAMUSCULAR | Status: AC
  Administered 2014-12-27: 5 mg via INTRAMUSCULAR
  Filled 2014-12-27: qty 1

## 2014-12-27 MED ORDER — LORAZEPAM 2 MG/ML IJ SOLN
2.0000 mg | Freq: Once | INTRAMUSCULAR | Status: AC
  Administered 2014-12-27: 2 mg via INTRAVENOUS
  Filled 2014-12-27: qty 1

## 2014-12-27 NOTE — Progress Notes (Signed)
Patient exiting bed on multiple occasions, prn ativan given 0.34ml IM due to impulsive behavior and anxiety with no relief, Bed alarm set, patient in camera room near nursing station, patient instructed on how to use call bell to call for assistance with teach back. Patient showed RN x 2 how to call for assistance by pressing the big red button.  Safety sitter requested and bed alarm on, MD paged.  Will continue to monitor patient.

## 2014-12-27 NOTE — Consult Note (Signed)
Urology Consult   Physician requesting consult: Dr. 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  History of Present Illness: Taylor Foley is a 64 y.o. with minimal access to medical care who sought medical attention for right shoulder pain, and a CT scan of the chest revealed lung metastases. A CT scan of the abd/pelv was obtained which showed liver, adrenal, and renal metastases, and a bladder mass. We are consulted for expedited workup and consideration of cystoscopy and bladder biopsy.  The patient is a very inaccurate historian. She recently received some ativan for agitation. She is currently undergoing a psychiatric evaluation for competency. Most of my history is abstracted from the medical record and the RN taking care of her.  She states that she saw blood in her urine for the first time months ago. She denies dysuria. She denies flank pain. She states that she does have some urgency. No incomplete emptying. How much she is bothered by her LUTS is unclear. She answered "not at all" and "a lot" at two different times of the interview.    She denies a history of voiding or storage urinary symptoms, hematuria, UTIs, STDs, urolithiasis, GU malignancy/trauma/surgery.  Past Medical History  Diagnosis Date  . Hypertension   . Hypercholesteremia   . Syncope and collapse 11/2011; 03/14/2013  . Shortness of breath     "sometimes lying down" (03/14/2013)  . Blood in the urine     "here lately; q time I go to pee" (03/14/2013)  . Arthritis     "all over" (03/24/2013)    Past Surgical History  Procedure Laterality Date  . Orif wrist fracture  12/04/2011    Procedure: OPEN REDUCTION INTERNAL FIXATION (ORIF) WRIST FRACTURE;  Surgeon: Marybelle Killings;  Location: [facility];  Service: Orthopedics;  Laterality: Left;     Current Hospital Medications:  Home meds:    Medication List    Notice    You have not been prescribed any medications.       Scheduled Meds: . cefTRIAXone (ROCEPHIN) IM  1 g Intramuscular Q24H  . heparin subcutaneous  5,000 Units Subcutaneous 3 times per day  . LORazepam  2 mg Intravenous Once   Continuous Infusions: . sodium chloride 1,000 mL (12/26/14 0634)   PRN Meds:.LORazepam, oxyCODONE  Allergies: No Known Allergies  Family History  Problem Relation Age of Onset  . Congestive Heart Failure Father   . Hypertension Father   . Hyperlipidemia Father   . Diabetes Other   . Heart disease Other   . Prostate cancer Father   . Ovarian cancer Mother   . Hypertension Mother     Social History:  reports that she has never smoked. She has never used smokeless tobacco. She reports that she does not drink alcohol or use illicit drugs.  ROS: A complete review of systems was performed.  All systems are negative except for pertinent findings as noted.  Physical Exam:  Vital signs in last 24 hours: Temp:  [98.2 F (36.8 C)-98.8 F (37.1 C)] 98.6 F (37 C) (01/23 0646) Pulse Rate:  [90-115] 115 (01/23 0646) Resp:  [20] 20 (01/22 1935) BP: (154-166)/(73-86) 154/73 mmHg (01/23 0646) SpO2:  [97 %-100 %] 97 % (01/23 0646) Constitutional:  Alert and oriented, No acute distress Cardiovascular: Regular rate and rhythm, No JVD Respiratory: Normal respiratory effort, Lungs clear bilaterally GI: Abdomen is soft, nontender, nondistended, no abdominal masses GU: No CVA tenderness Lymphatic: No lymphadenopathy Neurologic: Grossly intact, no  focal deficits Psychiatric: Normal mood and affect  Laboratory Data:   Recent Labs  12/25/14 1626 12/25/14 1811 12/27/14 0259  WBC 9.3 8.8 6.7  HGB 11.9* 11.3* 10.0*  HCT 36.8 34.7* 30.2*  PLT 392 345 252     Recent Labs  12/25/14 1626 12/26/14 0830 12/27/14 0259  NA 137 139 137  K 4.2 4.1 4.1  CL 103 109 107  GLUCOSE 105* 92 94  BUN 18 14 12   CALCIUM 9.0 8.1* 8.2*  CREATININE 1.13* 1.00 0.95     Results for orders placed or performed during the  hospital encounter of 12/25/14 (from the past 24 hour(s))  Comprehensive metabolic panel     Status: Abnormal   Collection Time: 12/27/14  2:59 AM  Result Value Ref Range   Sodium 137 135 - 145 mmol/L   Potassium 4.1 3.5 - 5.1 mmol/L   Chloride 107 96 - 112 mmol/L   CO2 22 19 - 32 mmol/L   Glucose, Bld 94 70 - 99 mg/dL   BUN 12 6 - 23 mg/dL   Creatinine, Ser 0.95 0.50 - 1.10 mg/dL   Calcium 8.2 (L) 8.4 - 10.5 mg/dL   Total Protein 5.2 (L) 6.0 - 8.3 g/dL   Albumin 2.4 (L) 3.5 - 5.2 g/dL   AST 33 0 - 37 U/L   ALT 22 0 - 35 U/L   Alkaline Phosphatase 60 39 - 117 U/L   Total Bilirubin 0.3 0.3 - 1.2 mg/dL   GFR calc non Af Amer 62 (L) >90 mL/min   GFR calc Af Amer 72 (L) >90 mL/min   Anion gap 8 5 - 15  CBC     Status: Abnormal   Collection Time: 12/27/14  2:59 AM  Result Value Ref Range   WBC 6.7 4.0 - 10.5 K/uL   RBC 3.57 (L) 3.87 - 5.11 MIL/uL   Hemoglobin 10.0 (L) 12.0 - 15.0 g/dL   HCT 30.2 (L) 36.0 - 46.0 %   MCV 84.6 78.0 - 100.0 fL   MCH 28.0 26.0 - 34.0 pg   MCHC 33.1 30.0 - 36.0 g/dL   RDW 13.5 11.5 - 15.5 %   Platelets 252 150 - 400 K/uL   Recent Results (from the past 240 hour(s))  Urine culture     Status: None   Collection Time: 12/25/14  5:56 PM  Result Value Ref Range Status   Specimen Description URINE, CLEAN CATCH  Final   Special Requests NONE  Final   Colony Count   Final    25,000 COLONIES/ML Performed at [facility]    Culture   Final    Multiple bacterial morphotypes present, none predominant. Suggest appropriate recollection if clinically indicated. Performed at [facility]    Report Status 12/26/2014 FINAL  Final  Blood culture (routine x 2)     Status: None (Preliminary result)   Collection Time: 12/25/14  6:00 PM  Result Value Ref Range Status   Specimen Description BLOOD ARM LEFT  Final   Special Requests BOTTLES DRAWN AEROBIC AND ANAEROBIC 10CC  Final   Culture   Final           BLOOD CULTURE RECEIVED NO GROWTH TO DATE  CULTURE WILL BE HELD FOR 5 DAYS BEFORE ISSUING A FINAL NEGATIVE REPORT Performed at [facility]    Report Status PENDING  Incomplete  Blood culture (routine x 2)     Status: None (Preliminary result)   Collection Time: 12/25/14  6:15  PM  Result Value Ref Range Status   Specimen Description BLOOD ARM RIGHT  Final   Special Requests BOTTLES DRAWN AEROBIC AND ANAEROBIC 10CC  Final   Culture   Final           BLOOD CULTURE RECEIVED NO GROWTH TO DATE CULTURE WILL BE HELD FOR 5 DAYS BEFORE ISSUING A FINAL NEGATIVE REPORT Performed at [facility]    Report Status PENDING  Incomplete  MRSA PCR Screening     Status: None   Collection Time: 12/25/14 10:30 PM  Result Value Ref Range Status   MRSA by PCR NEGATIVE NEGATIVE Final    Comment:        The GeneXpert MRSA Assay (FDA approved for NASAL specimens only), is one component of a comprehensive MRSA colonization surveillance program. It is not intended to diagnose MRSA infection nor to guide or monitor treatment for MRSA infections.     Renal Function:  Recent Labs  12/25/14 1626 12/26/14 0830 12/27/14 0259  CREATININE 1.13* 1.00 0.95   Estimated Creatinine Clearance: 61.6 mL/min (by C-G formula based on Cr of 0.95).  Radiologic Imaging: Dg Chest 2 View  12/25/2014   CLINICAL DATA:  Chest pain for 1 day, history hypertension  EXAM: CHEST  2 VIEW  COMPARISON:  03/14/2013  FINDINGS: Normal heart size, mediastinal contours, and pulmonary vascularity.  Multiple small nodular density of identified concerning for multiple pulmonary nodules.  Scattered interstitial prominence and suspect underlying emphysematous changes.  No segmental consolidation, pleural effusion or pneumothorax.  Bones appear demineralized.  IMPRESSION: Emphysematous and chronic interstitial changes with new subtle nodular densities in both lungs suspicious for pulmonary nodules ; further evaluation by CT chest with contrast recommended for further  evaluation.   Electronically Signed   By: Lavonia Dana M.D.   On: 12/25/2014 17:00   Ct Head Wo Contrast  12/25/2014   CLINICAL DATA:  Confusion  EXAM: CT HEAD WITHOUT CONTRAST  TECHNIQUE: Contiguous axial images were obtained from the base of the skull through the vertex without intravenous contrast.  COMPARISON:  03/14/2013  FINDINGS: Imaging at the vertex is motion degraded. This could miss a subtle or small finding, but no acute pathology is suspected at this level.  Skull and Sinuses:Negative for fracture or destructive process. The mastoids, middle ears, and imaged paranasal sinuses are clear.  Orbits: No acute abnormality.  Brain: There is no evidence of acute infarct, hemorrhage, obstructive hydrocephalus, or mass lesion. Mildly increased lateral and third ventriculomegaly, out of proportion to brain atrophy. This pattern has been seen previously and is suggestive of a communicating hydrocephalus. There is chronic small-vessel disease with patchy ischemic gliosis in the bilateral cerebral white matter. There is been a small remote cortical infarct in the high left parietal lobe. Stable retro cerebellar CSF accumulation without mass effect on the fourth ventricle.  IMPRESSION: 1. No acute findings on this degraded study. 2. Mild progression of ventriculomegaly since 2014. 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 3. 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   Electronically Signed   By: Jorje Guild M.D.   On: 12/25/2014 21:34   Ct Angio Chest Pe W/cm &/or Wo Cm  12/25/2014   CLINICAL DATA:  64 year old female with 1 day history of right-sided chest pain.  EXAM: CT ANGIOGRAPHY CHEST WITH CONTRAST  TECHNIQUE: Multidetector CT imaging of the chest was performed using the standard protocol during bolus administration of intravenous contrast. Multiplanar CT image reconstructions and MIPs were obtained to evaluate  the vascular anatomy.  CONTRAST:  172mL OMNIPAQUE  IOHEXOL 350 MG/ML SOLN  COMPARISON:  No priors.  FINDINGS: Mediastinum/Lymph Nodes: There are no filling defects within the pulmonary arterial tree to suggest underlying pulmonary embolism. Heart size is borderline enlarged. Trace amount of pericardial fluid and/or thickening, unlikely to be of hemodynamic significance at this time. No associated pericardial calcification. No pathologically enlarged mediastinal or hilar lymph nodes. Esophagus is unremarkable in appearance. No axillary lymphadenopathy.  Lungs/Pleura: There are innumerable pulmonary nodules scattered throughout the lungs bilaterally, many of which are cavitary. These appear randomly distributed, although there is a slight craniocaudal gradient. The largest nodule is in the left upper lobe measuring up to 1.5 x 1.0 cm (image 34 of series 6). No confluent consolidative airspace disease. No pleural effusions.  Musculoskeletal/Soft Tissues: There are no aggressive appearing lytic or blastic lesions noted in the visualized portions of the skeleton.  Upper Abdomen: There are innumerable ill-defined intermediate attenuation lesions scattered throughout the hepatic parenchyma, highly concerning for widespread metastatic disease. The largest of these lesions is in segment 4A (image 63 of series 4) measuring 2.1 x 2.4 cm. Large nodule in the right adrenal gland measuring 2.5 x 1.6 cm.  Review of the MIP images confirms the above findings.  IMPRESSION: 1. No evidence of pulmonary embolism. 2. Findings in the lungs and liver highly concerning for widespread metastatic disease. Given the cavitary nature of many of the pulmonary nodules, a primary squamous cell carcinoma is suspected. It is possible that the cavitary pulmonary nodules could reflect widespread septic emboli, however, that is not favored, and would not account for the hepatic findings. 3. Right adrenal nodule also concerning for metastatic disease. These results were called by telephone at the time  of interpretation on 12/25/2014 at 7:14 pm to Dr. Kirstie Peri, who verbally acknowledged these results.   Electronically Signed   By: Vinnie Langton M.D.   On: 12/25/2014 19:17   Ct Abdomen Pelvis W Contrast  12/26/2014   CLINICAL DATA:  Metastatic adenocarcinoma  EXAM: CT ABDOMEN AND PELVIS WITH CONTRAST  TECHNIQUE: Multidetector CT imaging of the abdomen and pelvis was performed using the standard protocol following bolus administration of intravenous contrast.  CONTRAST:  131mL OMNIPAQUE IOHEXOL 300 MG/ML  SOLN  COMPARISON:  CT scan of the chest 12/25/2014  FINDINGS: Again noted bilateral lung bases small nodules consistent with metastatic disease.  Sagittal images of the spine shows degenerative changes thoracolumbar spine. No destructive bony lesions are noted. No calcified gallstones are noted within gallbladder.  Again noted innumerable liver lesions consistent with metastatic disease. The largest lesion in right hepatic dome posteriorly measures 4.3 cm. Largest lesion and right hepatic dome anteriorly measures 2.8 cm. Largest lesion in left hepatic lobe anteriorly measures 1.9 cm.  A again noted nodular mass in right adrenal gland measures 2.7 cm highly suspicious for metastatic disease. There are scattered low-density lesions within right kidney the largest measures about 9 mm. Metastatic disease cannot be excluded. There is a probable cyst within left kidney posteriorly measures 1.2 cm. At least 2 ill-defined lesions are noted within lower pole of the left kidney the largest measures 7 mm suspicious for metastatic disease.  There is no hydronephrosis or hydroureter. Delayed renal images shows bilateral renal symmetrical excretion.  Bilateral visualized proximal ureter is unremarkable.  No small bowel obstruction.  No aortic aneurysm.  The terminal ileum is unremarkable. No pericecal inflammation. There is significant irregular thickening of urinary bladder wall. Posterior scattered right posterior  bladder wall measures at least 3 cm in thickness. Findings are highly suspicious for urinary bladder mass.  The left adrenal gland is unremarkable. The pancreas and spleen is unremarkable. No breast masses are identified in visualized breast tissue. There is no inguinal adenopathy.  The uterus is atrophic. No adnexal masses noted. Probable splenic cyst measures 9 mm.  IMPRESSION: 1. Again noted multiple pulmonary nodules lung bases consistent with metastatic disease. 2. Multiple nodular masses within liver consistent with metastatic disease. 3. Again noted right adrenal mass measures at least 2.7 cm highly suspicious for metastatic disease. 4. Bilateral small indeterminate low-density lesions bilateral kidneys. Although some of the lesions measured cysts metastatic disease cannot be excluded. Bilateral renal symmetrical excretion. No hydronephrosis or hydroureter. 5. No small bowel obstruction.  No ascites or free air. 6. There is irregular thickening of urinary bladder wall with mass like appearance in right posterior aspect of the urinary bladder. A urinary bladder mass is highly suspected. Correlation with urology exam and cystoscopy is recommended. 7. Atrophic uterus.  No inguinal adenopathy.  No adnexal mass.   Electronically Signed   By: Lahoma Crocker M.D.   On: 12/26/2014 18:21    I independently reviewed the above imaging studies.  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  She has irritative LUTS from this, but how bothered she is by it, is difficult to determine, especially given her possible psychiatric comorbidities.  Our recommendation would be cystoscopy, bladder biopsy for tissue diagnosis, but she is currently refusing this. Whether she has capacity to refuse this therapy, I will defer to psychiatry. If a health care power of attorney is designated, please let us know, and we can speak to them.  Of course, the  curability of metastatic bladder cancer is zero, and my worry is that she will not be a good candidate for cisplatin based chemotherapy. Her treatment goals would then be palliation in the form of maximal TURBT, cystectomy, or bilateral neph tubes, with their inherent quality of life impacts and morbidity. It is difficult for me to even pin her down on how bothered she is by her lower urinary tract symptoms.  We will await further instruction once our pyschiatric colleagues have evaluated her.  Thank you for including [place] in the care of this patient.    Margo Aye 12/27/2014, 12:23 PM   I have personally seen and examined the patient.  I agree with Dr. Johnnye Sima evaluation, assessment and treatment plan.  Ms. Blass appears to have metastatic bladder cancer that is likely incurable.  However, diagnosis would require cystoscopy under anesthesia and transurethral resection/biopsy of her bladder mass for tissue diagnosis and for palliation of her hematuria and bothersome voiding symptoms.  I discussed the potential benefits and risks of the procedure, side effects of the proposed treatment, the likelihood of the patient achieving the goals of the procedure, and any potential problems that might occur during the procedure or recuperation.  Although, she expresses her understanding when I ask her, she does not seem to have the capacity to fully understand the nature of her probable diagnosis and prognosis.  Optimal treatment of metastatic bladder cancer would involve cisplatin based systemic chemotherapy and I am unsure if she would be a candidate for this treatment vs. strictly palliative care.  She will eventually need medical oncology involvement in her care to make that decision. Psychiatry is to evaluate her later today, which will likely be very helpful  to determine her capacity to make medical decisions.    I have made an attempt to call her son who is supposedly her POA but was unable to  reach him (left a message for him to call me).  If she ultimately is agreeable to proceed with further evaluation, I would likely plan for cystoscopy, bilateral retrograde pyelography, transurethral resection of bladder tumor, and examination under anesthesia.  This can be scheduled as an elective outpatient procedure although I would likely keep her hospitalized overnight considering she lives alone.  We did discuss the potential implications of refusing further care.  From a urologic standpoint, this could involve worsening hematuria and lower urinary tract symptoms as well as ureteral obstruction and possible renal failure.  Her life expectancy is likely 6 months to 2 years with aggressive treatment and less than one year without chemotherapy.  Will await further input from psychiatry and her son.  There may eventually be benefit from medical oncology and palliative care consultation with her son heavily involved in those discussions.   Pryor Curia. MD

## 2014-12-27 NOTE — Progress Notes (Signed)
[facility] PROGRESS NOTE  Taylor Foley HEN:[id number] DOB: 03/03/1951 DOA: 12/25/2014 PCP: PROVIDER NOT IN SYSTEM  Assessment/Plan: Right-Sided Chest and Shoulder Pain -Doubt ACS;  troponins negative, no acute ischemic changes on EKG. -Suspect may be from what appears to be metastatic cancer. -Pain meds PRN.  Metastatic Cancer, Likely, new diagnosis -Based on results of CT abdomen/pelvis, looks like primary may be bladder. -Discussed with GU who will see in consult for possible cystoscopy and biopsy (Dr. Glo Herring). -Multiple pulmonary, hepatic and adrenal nodules.  UTI -Cx with multiple morphotypes. -Will elect to treat with antibiotics given bladder findings on CT. -As patient has lost IV, will change to PO cipro for 5 more days.  Acute Encephalopathy -CT Head negative. -No focal abnormalities on exam. -Will check MRI Brain to make sure we are not missing any mets or other medical conditions. -Discussed with son Elta [place], and she has been displaying this odd behavior from some months now. -Will request psych consult for further recommendations as well as to determine capacity as she will likely need to undergo treatment for what appears to be metastatic bladder cancer.  ? Cellulitis of the Lower Extremities -No redness evident on exam today.  ?Murmur -No murmur on cardiac auscultation today. -ECHO does not reveal any valvular abnormalities.  Social Issues -Patient appears unkempt and has not had medical care. -SW consult requested. -May need placement prior to DC.  Code Status: Full Code Family Communication: Discussed with son Elta [place] via phone 1/22. Disposition Plan: to be determined   Consultants:  GU   Antibiotics:  Cipro  Subjective: Agitated thru out the night, pulling at her IVs. Seems unable to understand her current situation.  Objective: Filed Vitals:   12/26/14 1720 12/26/14 1935 12/27/14 0646 12/27/14 1352  BP: 166/86 161/80 154/73  128/62  Pulse: 90 99 115 96  Temp: 98.8 F (37.1 C) 98.2 F (36.8 C) 98.6 F (37 C) 98.5 F (36.9 C)  TempSrc: Oral Oral Oral Axillary  Resp: 20 20  19   Height:      Weight:      SpO2: 100% 100% 97% 97%    Intake/Output Summary (Last 24 hours) at 12/27/14 1556 Last data filed at 12/26/14 1840  Gross per 24 hour  Intake    240 ml  Output      0 ml  Net    240 ml   Filed Weights   12/25/14 2201 12/26/14 0423  Weight: 74.481 kg (164 lb 3.2 oz) 73.988 kg (163 lb 1.8 oz)    Exam:   General:  Confused, awake  Cardiovascular: RRR  Respiratory: CTA B  Abdomen: S/NT/ND/+BS  Extremities: 1-2+ edema bilaterally   Neurologic:  Non-focal  Data Reviewed: Basic Metabolic Panel:  Recent Labs Lab 12/25/14 1626 12/26/14 0830 12/27/14 0259  NA 137 139 137  K 4.2 4.1 4.1  CL 103 109 107  CO2 26 24 22   GLUCOSE 105* 92 94  BUN 18 14 12   CREATININE 1.13* 1.00 0.95  CALCIUM 9.0 8.1* 8.2*   Liver Function Tests:  Recent Labs Lab 12/26/14 0830 12/27/14 0259  AST 25 33  ALT 19 22  ALKPHOS 57 60  BILITOT 0.5 0.3  PROT 5.1* 5.2*  ALBUMIN 2.4* 2.4*   No results for input(s): LIPASE, AMYLASE in the last 168 hours. No results for input(s): AMMONIA in the last 168 hours. CBC:  Recent Labs Lab 12/25/14 1626 12/25/14 1811 12/27/14 0259  WBC 9.3 8.8 6.7  NEUTROABS  --  6.0  --   HGB 11.9* 11.3* 10.0*  HCT 36.8 34.7* 30.2*  MCV 85.0 84.2 84.6  PLT 392 345 252   Cardiac Enzymes: No results for input(s): CKTOTAL, CKMB, CKMBINDEX, TROPONINI in the last 168 hours. BNP (last 3 results) No results for input(s): PROBNP in the last 8760 hours. CBG: No results for input(s): GLUCAP in the last 168 hours.  Recent Results (from the past 240 hour(s))  Urine culture     Status: None   Collection Time: 12/25/14  5:56 PM  Result Value Ref Range Status   Specimen Description URINE, CLEAN CATCH  Final   Special Requests NONE  Final   Colony Count   Final    25,000  COLONIES/ML Performed at [facility]    Culture   Final    Multiple bacterial morphotypes present, none predominant. Suggest appropriate recollection if clinically indicated. Performed at [facility]    Report Status 12/26/2014 FINAL  Final  Blood culture (routine x 2)     Status: None (Preliminary result)   Collection Time: 12/25/14  6:00 PM  Result Value Ref Range Status   Specimen Description BLOOD ARM LEFT  Final   Special Requests BOTTLES DRAWN AEROBIC AND ANAEROBIC 10CC  Final   Culture   Final           BLOOD CULTURE RECEIVED NO GROWTH TO DATE CULTURE WILL BE HELD FOR 5 DAYS BEFORE ISSUING A FINAL NEGATIVE REPORT Performed at [facility]    Report Status PENDING  Incomplete  Blood culture (routine x 2)     Status: None (Preliminary result)   Collection Time: 12/25/14  6:15 PM  Result Value Ref Range Status   Specimen Description BLOOD ARM RIGHT  Final   Special Requests BOTTLES DRAWN AEROBIC AND ANAEROBIC 10CC  Final   Culture   Final           BLOOD CULTURE RECEIVED NO GROWTH TO DATE CULTURE WILL BE HELD FOR 5 DAYS BEFORE ISSUING A FINAL NEGATIVE REPORT Performed at [facility]    Report Status PENDING  Incomplete  MRSA PCR Screening     Status: None   Collection Time: 12/25/14 10:30 PM  Result Value Ref Range Status   MRSA by PCR NEGATIVE NEGATIVE Final    Comment:        The GeneXpert MRSA Assay (FDA approved for NASAL specimens only), is one component of a comprehensive MRSA colonization surveillance program. It is not intended to diagnose MRSA infection nor to guide or monitor treatment for MRSA infections.      Studies: Dg Chest 2 View  12/25/2014   CLINICAL DATA:  Chest pain for 1 day, history hypertension  EXAM: CHEST  2 VIEW  COMPARISON:  03/14/2013  FINDINGS: Normal heart size, mediastinal contours, and pulmonary vascularity.  Multiple small nodular density of identified concerning for multiple pulmonary nodules.   Scattered interstitial prominence and suspect underlying emphysematous changes.  No segmental consolidation, pleural effusion or pneumothorax.  Bones appear demineralized.  IMPRESSION: Emphysematous and chronic interstitial changes with new subtle nodular densities in both lungs suspicious for pulmonary nodules ; further evaluation by CT chest with contrast recommended for further evaluation.   Electronically Signed   By: Lavonia Dana M.D.   On: 12/25/2014 17:00   Ct Head Wo Contrast  12/25/2014   CLINICAL DATA:  Confusion  EXAM: CT HEAD WITHOUT CONTRAST  TECHNIQUE: Contiguous axial images were obtained from the base of the skull  through the vertex without intravenous contrast.  COMPARISON:  03/14/2013  FINDINGS: Imaging at the vertex is motion degraded. This could miss a subtle or small finding, but no acute pathology is suspected at this level.  Skull and Sinuses:Negative for fracture or destructive process. The mastoids, middle ears, and imaged paranasal sinuses are clear.  Orbits: No acute abnormality.  Brain: There is no evidence of acute infarct, hemorrhage, obstructive hydrocephalus, or mass lesion. Mildly increased lateral and third ventriculomegaly, out of proportion to brain atrophy. This pattern has been seen previously and is suggestive of a communicating hydrocephalus. There is chronic small-vessel disease with patchy ischemic gliosis in the bilateral cerebral white matter. There is been a small remote cortical infarct in the high left parietal lobe. Stable retro cerebellar CSF accumulation without mass effect on the fourth ventricle.  IMPRESSION: 1. No acute findings on this degraded study. 2. Mild progression of ventriculomegaly since 2014. 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 3. 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   Electronically Signed   By: Jorje Guild M.D.   On: 12/25/2014 21:34   Ct Angio Chest Pe W/cm &/or Wo Cm  12/25/2014    CLINICAL DATA:  64 year old female with 1 day history of right-sided chest pain.  EXAM: CT ANGIOGRAPHY CHEST WITH CONTRAST  TECHNIQUE: Multidetector CT imaging of the chest was performed using the standard protocol during bolus administration of intravenous contrast. Multiplanar CT image reconstructions and MIPs were obtained to evaluate the vascular anatomy.  CONTRAST:  110mL OMNIPAQUE IOHEXOL 350 MG/ML SOLN  COMPARISON:  No priors.  FINDINGS: Mediastinum/Lymph Nodes: There are no filling defects within the pulmonary arterial tree to suggest underlying pulmonary embolism. Heart size is borderline enlarged. Trace amount of pericardial fluid and/or thickening, unlikely to be of hemodynamic significance at this time. No associated pericardial calcification. No pathologically enlarged mediastinal or hilar lymph nodes. Esophagus is unremarkable in appearance. No axillary lymphadenopathy.  Lungs/Pleura: There are innumerable pulmonary nodules scattered throughout the lungs bilaterally, many of which are cavitary. These appear randomly distributed, although there is a slight craniocaudal gradient. The largest nodule is in the left upper lobe measuring up to 1.5 x 1.0 cm (image 34 of series 6). No confluent consolidative airspace disease. No pleural effusions.  Musculoskeletal/Soft Tissues: There are no aggressive appearing lytic or blastic lesions noted in the visualized portions of the skeleton.  Upper Abdomen: There are innumerable ill-defined intermediate attenuation lesions scattered throughout the hepatic parenchyma, highly concerning for widespread metastatic disease. The largest of these lesions is in segment 4A (image 63 of series 4) measuring 2.1 x 2.4 cm. Large nodule in the right adrenal gland measuring 2.5 x 1.6 cm.  Review of the MIP images confirms the above findings.  IMPRESSION: 1. No evidence of pulmonary embolism. 2. Findings in the lungs and liver highly concerning for widespread metastatic disease.  Given the cavitary nature of many of the pulmonary nodules, a primary squamous cell carcinoma is suspected. It is possible that the cavitary pulmonary nodules could reflect widespread septic emboli, however, that is not favored, and would not account for the hepatic findings. 3. Right adrenal nodule also concerning for metastatic disease. These results were called by telephone at the time of interpretation on 12/25/2014 at 7:14 pm to Dr. Kirstie Peri, who verbally acknowledged these results.   Electronically Signed   By: Vinnie Langton M.D.   On: 12/25/2014 19:17   Ct Abdomen Pelvis W Contrast  12/26/2014   CLINICAL DATA:  Metastatic  adenocarcinoma  EXAM: CT ABDOMEN AND PELVIS WITH CONTRAST  TECHNIQUE: Multidetector CT imaging of the abdomen and pelvis was performed using the standard protocol following bolus administration of intravenous contrast.  CONTRAST:  13mL OMNIPAQUE IOHEXOL 300 MG/ML  SOLN  COMPARISON:  CT scan of the chest 12/25/2014  FINDINGS: Again noted bilateral lung bases small nodules consistent with metastatic disease.  Sagittal images of the spine shows degenerative changes thoracolumbar spine. No destructive bony lesions are noted. No calcified gallstones are noted within gallbladder.  Again noted innumerable liver lesions consistent with metastatic disease. The largest lesion in right hepatic dome posteriorly measures 4.3 cm. Largest lesion and right hepatic dome anteriorly measures 2.8 cm. Largest lesion in left hepatic lobe anteriorly measures 1.9 cm.  A again noted nodular mass in right adrenal gland measures 2.7 cm highly suspicious for metastatic disease. There are scattered low-density lesions within right kidney the largest measures about 9 mm. Metastatic disease cannot be excluded. There is a probable cyst within left kidney posteriorly measures 1.2 cm. At least 2 ill-defined lesions are noted within lower pole of the left kidney the largest measures 7 mm suspicious for metastatic  disease.  There is no hydronephrosis or hydroureter. Delayed renal images shows bilateral renal symmetrical excretion.  Bilateral visualized proximal ureter is unremarkable.  No small bowel obstruction.  No aortic aneurysm.  The terminal ileum is unremarkable. No pericecal inflammation. There is significant irregular thickening of urinary bladder wall. Posterior scattered right posterior bladder wall measures at least 3 cm in thickness. Findings are highly suspicious for urinary bladder mass.  The left adrenal gland is unremarkable. The pancreas and spleen is unremarkable. No breast masses are identified in visualized breast tissue. There is no inguinal adenopathy.  The uterus is atrophic. No adnexal masses noted. Probable splenic cyst measures 9 mm.  IMPRESSION: 1. Again noted multiple pulmonary nodules lung bases consistent with metastatic disease. 2. Multiple nodular masses within liver consistent with metastatic disease. 3. Again noted right adrenal mass measures at least 2.7 cm highly suspicious for metastatic disease. 4. Bilateral small indeterminate low-density lesions bilateral kidneys. Although some of the lesions measured cysts metastatic disease cannot be excluded. Bilateral renal symmetrical excretion. No hydronephrosis or hydroureter. 5. No small bowel obstruction.  No ascites or free air. 6. There is irregular thickening of urinary bladder wall with mass like appearance in right posterior aspect of the urinary bladder. A urinary bladder mass is highly suspected. Correlation with urology exam and cystoscopy is recommended. 7. Atrophic uterus.  No inguinal adenopathy.  No adnexal mass.   Electronically Signed   By: Lahoma Crocker M.D.   On: 12/26/2014 18:21    Scheduled Meds: . cefTRIAXone (ROCEPHIN) IM  1 g Intramuscular Q24H  . heparin subcutaneous  5,000 Units Subcutaneous 3 times per day   Continuous Infusions: . sodium chloride 1,000 mL (12/26/14 6237)    Principal Problem:   Chest  pain Active Problems:   Multiple lung nodules   Liver lesion    Time spent: 40 minutes. Greater than 50% of this time was spent in direct contact with the patient coordinating care.    Lelon Frohlich  [facility] Pager [phone]  If 7PM-7AM, please contact night-coverage at [email or web address], password [facility] 12/27/2014, 3:56 PM  LOS: 2 days

## 2014-12-27 NOTE — Progress Notes (Signed)
PT Cancellation Note  Patient Details Name: Taylor Foley MRN: [id number] DOB: [birth year]   Cancelled Treatment:    Reason Eval/Treat Not Completed: Other (comment) (Agitation) RN reports patient has been increasingly agitated this AM and has just fallen asleep. Requests PT eval be held at this time. Will follow up as time allows.  Ellouise Newer 12/27/2014, 9:22 AM Elayne Snare, [place]

## 2014-12-28 DIAGNOSIS — F4323 Adjustment disorder with mixed anxiety and depressed mood: Secondary | ICD-10-CM

## 2014-12-28 MED ORDER — CIPROFLOXACIN HCL 500 MG PO TABS
500.0000 mg | ORAL_TABLET | Freq: Two times a day (BID) | ORAL | Status: DC
  Administered 2014-12-28 – 2014-12-31 (×6): 500 mg via ORAL
  Filled 2014-12-28 (×6): qty 1

## 2014-12-28 MED ORDER — LORAZEPAM 1 MG PO TABS
2.0000 mg | ORAL_TABLET | Freq: Once | ORAL | Status: AC
  Administered 2014-12-28: 2 mg via ORAL
  Filled 2014-12-28: qty 2

## 2014-12-28 NOTE — Consult Note (Addendum)
[facility] Consult   Reason for Consult: Bizarre behavior, medication recomendation Referring Physician:  Dr.Hernandez [place] Patient Identification: Taylor Foley MRN:  [id number] Principal Diagnosis: Adjustment disorder with mixed anxiety and depression Diagnosis:   Patient Active Problem List   Diagnosis Date Noted  . Chest pain [R07.9] 12/25/2014  . Liver lesion [K76.89] 12/25/2014  . Multiple lung nodules [R91.8] 12/25/2014  . Syncope [R55] 03/14/2013  . HTN (hypertension) [I10] 03/14/2013    Total Time spent with patient: 1 hour  Subjective:   Taylor Foley is a 64 y.o. female patient admitted with chest pain  HPI: Thanks for asking the psychiatric service  to evaluate Taylor Foley who reports that she came to the hospital yesterday due to dizziness and blackouts. However, information obtained  from her charts, provider and nurse  showed that she came to the hospital presenting with chest pain and was later discovered to have multiple lungs nodules on CT scan. Per report patient was also exhibiting unsafe behavior, agitation, paranoia and poor safety awareness with clear signs of psychosis yesterday. However, today, patient is alert and oriented to time,  place, and person. She denies any prior history of mental illness. She reports that she was living alone prior to her admission and her 65 year old son checks on her from time to time. Patient had a 12 grade education and was working with [employer] until 2 years ago. She reports financial problem but gets food stamp to get by. Patient does not have a cooking stove in her home and does not see her situation as a problem. It was reported that patient was not grooming herself at home and clearly has difficulty living alone. Patient appears anxious and depressed but denies any form of depression or anxiety. She denies delusional thinking or psychosis. Patient does no seems to understand her current medical  problem and  insist on talking to her son whom she expect to visit her today.  HPI Elements:   Location:  anxiety, depression. Quality:  Mild to moderate Duration:  2 days. Context:  recently diagnosed with cancer.    Past Medical History:  Past Medical History  Diagnosis Date  . Hypertension   . Hypercholesteremia   . Syncope and collapse 11/2011; 03/14/2013  . Shortness of breath     "sometimes lying down" (03/14/2013)  . Blood in the urine     "here lately; q time I go to pee" (03/14/2013)  . Arthritis     "all over" (03/24/2013)    Past Surgical History  Procedure Laterality Date  . Orif wrist fracture  12/04/2011    Procedure: OPEN REDUCTION INTERNAL FIXATION (ORIF) WRIST FRACTURE;  Surgeon: Marybelle Killings;  Location: [facility];  Service: Orthopedics;  Laterality: Left;   Family History:  Family History  Problem Relation Age of Onset  . Congestive Heart Failure Father   . Hypertension Father   . Hyperlipidemia Father   . Diabetes Other   . Heart disease Other   . Prostate cancer Father   . Ovarian cancer Mother   . Hypertension Mother    Social History:  History  Alcohol Use No     History  Drug Use No    History   Social History  . Marital Status: Single    Spouse Name: N/A    Number of Children: N/A  . Years of Education: N/A   Social History Main Topics  . Smoking status: Never Smoker   . Smokeless tobacco: Never  Used  . Alcohol Use: No  . Drug Use: No  . Sexual Activity: No   Other Topics Concern  . None   Social History Narrative   Additional Social History:                         Allergies:  No Known Allergies  Vitals: Blood pressure 134/79, pulse 117, temperature 99.6 F (37.6 C), temperature source Oral, resp. rate 20, height 5\' 6"  (1.676 m), weight 73.988 kg (163 lb 1.8 oz), SpO2 98 %.  Risk to Self: Is patient at risk for suicide?: No Risk to Others:   Prior Inpatient Therapy:   Prior Outpatient Therapy:    Current  Facility-Administered Medications  Medication Dose Route Frequency Provider Last Rate Last Dose  . 0.9 %  sodium chloride infusion  1,000 mL Intravenous Continuous Kirstie Peri, MD 125 mL/hr at 12/26/14 0634 1,000 mL at 12/26/14 0634  . cefTRIAXone (ROCEPHIN) injection 1 g  1 g Intramuscular Q24H Ritta Slot, NP   1 g at 12/27/14 2156  . heparin injection 5,000 Units  5,000 Units Subcutaneous 3 times per day Erline Hau, MD   5,000 Units at 12/28/14 0735  . LORazepam (ATIVAN) injection 1 mg  1 mg Intramuscular Q4H PRN Ritta Slot, NP   1 mg at 12/27/14 1953  . oxyCODONE (Oxy IR/ROXICODONE) immediate release tablet 5 mg  5 mg Oral Q6H PRN Erline Hau, MD   5 mg at 12/26/14 1700    Musculoskeletal: Strength & Muscle Tone: within normal limits Gait & Station: normal Patient leans: N/A  Psychiatric Specialty Exam: Physical Exam  Review of Systems  Constitutional: Positive for malaise/fatigue.  Eyes: Negative.   Respiratory: Positive for shortness of breath.   Gastrointestinal: Negative.   Skin: Negative.   Neurological: Positive for dizziness and weakness.  Psychiatric/Behavioral: The patient is nervous/anxious.     Blood pressure 134/79, pulse 117, temperature 99.6 F (37.6 C), temperature source Oral, resp. rate 20, height 5\' 6"  (1.676 m), weight 73.988 kg (163 lb 1.8 oz), SpO2 98 %.Body mass index is 26.34 kg/(m^2).  General Appearance: Casual  Eye Contact::  Good  Speech:  Clear and Coherent  Volume:  Normal  Mood:  Anxious  Affect:  Constricted and Depressed  Thought Process:  Goal Directed  Orientation:  Full (Time, Place, and Person)  Thought Content:  Negative  Suicidal Thoughts:  No  Homicidal Thoughts:  No  Memory:  Immediate;   Good Recent;   Good Remote;   Good  Judgement:  Fair  Insight:  Lacking  Psychomotor Activity:  Normal  Concentration:  Fair  Recall:  Reading of Knowledge:Good  Language: Good  Akathisia:  No  Handed:   Right  AIMS (if indicated):     Assets:  Communication Skills  ADL's:  Intact  Cognition: WNL  Sleep:      Medical Decision Making: Established Problem, Stable/Improving (1) Patient seems to have capacity to make her medical decision for now, this may change in the future.   Treatment Plan Summary: Medication management: Will initiate Celexa 10mg  daily at bedtime for depression/anxiety. Continue Ativan as needed for agitation.  Plan/Recomendation:  No evidence of imminent risk to self or others at present.   Patient does not meet criteria for psychiatric inpatient admission. Social worker consult for out of home placement, current home is not a safe environment. Please contact family for health care proxy  Disposition: Please re-consult  Psychiatric service if necessary.  Corena Pilgrim, MD 12/28/2014 1:52 PM

## 2014-12-28 NOTE — Progress Notes (Signed)
[facility] PROGRESS NOTE  Taylor Foley YNW:[id number] DOB: October 24, 1951 DOA: 12/25/2014 PCP: PROVIDER NOT IN SYSTEM  Assessment/Plan: Right-Sided Chest and Shoulder Pain -Doubt ACS;  troponins negative, no acute ischemic changes on EKG. -Suspect may be from what appears to be metastatic bladder cancer. -Pain meds PRN.  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 -Appreciate GU consultation and recommendations (poor prognosis with and without treatment it appears). -I discussed with psychiatry via phone. Base on their assessment today, they believe she has capacity to make medical decisions. She has absolutely no insight into her medical issues. I think a palliative care consult would be beneficial. Her son, Elta Guadeloupe needs to be involved in the decision-making process. -Multiple pulmonary, hepatic and adrenal nodules.  UTI -Cx with multiple morphotypes. -Will elect to treat with antibiotics given bladder findings on CT. -As patient has lost IV, will change to PO cipro for 5 more days.  Acute Encephalopathy -CT Head negative. -No focal abnormalities on exam. -Unable to complete MRI Brain as she did not cooperate despite ativan given. -Discussed with son Elta Guadeloupe, and she has been displaying this odd behavior from some months now. -Seen by psych who has started an antidepressent, but believes she has decision-making capacity.  ? Cellulitis of the Lower Extremities -No redness evident on exam.  ?Murmur -No murmur on cardiac auscultation. -ECHO does not reveal any valvular abnormalities.  Social Issues -Patient appears unkempt and has not had access to medical care. -She admits today to not having running water or a stove and was extremely disheveled on admission. -Strong concerns about safety of DC home. -Will request SW to see in am. -Believe she would benefit from placement in ALF/SNF.  Code Status: Full  Code Family Communication: Discussed with son Elta Guadeloupe via phone 1/22; have been unable to reach since. Disposition Plan: to be determined   Consultants:  GU   Antibiotics:  Cipro  Subjective: Currently calm, appears reasonable but has no insight into her disease process. Does not understand that she has incurable cancer, in fact tells me today that she had no idea about her diagnosis, despite me and multiple other providers mentioning this every day since admission.  Objective: Filed Vitals:   12/27/14 0646 12/27/14 1352 12/27/14 2028 12/28/14 0726  BP: 154/73 128/62 166/82 134/79  Pulse: 115 96 102 117  Temp: 98.6 F (37 C) 98.5 F (36.9 C) 98.4 F (36.9 C) 99.6 F (37.6 C)  TempSrc: Oral Axillary Oral Oral  Resp:  19 20 20   Height:      Weight:      SpO2: 97% 97% 98% 98%    Intake/Output Summary (Last 24 hours) at 12/28/14 1523 Last data filed at 12/28/14 0920  Gross per 24 hour  Intake    600 ml  Output    200 ml  Net    400 ml   Filed Weights   12/25/14 2201 12/26/14 0423  Weight: 74.481 kg (164 lb 3.2 oz) 73.988 kg (163 lb 1.8 oz)    Exam:   General:   Awake, calm  Cardiovascular: RRR  Respiratory: CTA B  Abdomen: S/NT/ND/+BS  Extremities: 1-2+ edema bilaterally   Neurologic:  Non-focal  Data Reviewed: Basic Metabolic Panel:  Recent Labs Lab 12/25/14 1626 12/26/14 0830 12/27/14 0259  NA 137 139 137  K 4.2 4.1 4.1  CL 103 109 107  CO2 26 24 22   GLUCOSE 105* 92 94  BUN 18  14 12  CREATININE 1.13* 1.00 0.95  CALCIUM 9.0 8.1* 8.2*   Liver Function Tests:  Recent Labs Lab 12/26/14 0830 12/27/14 0259  AST 25 33  ALT 19 22  ALKPHOS 57 60  BILITOT 0.5 0.3  PROT 5.1* 5.2*  ALBUMIN 2.4* 2.4*   No results for input(s): LIPASE, AMYLASE in the last 168 hours. No results for input(s): AMMONIA in the last 168 hours. CBC:  Recent Labs Lab 12/25/14 1626 12/25/14 1811 12/27/14 0259  WBC 9.3 8.8 6.7  NEUTROABS  --  6.0  --   HGB  11.9* 11.3* 10.0*  HCT 36.8 34.7* 30.2*  MCV 85.0 84.2 84.6  PLT 392 345 252   Cardiac Enzymes: No results for input(s): CKTOTAL, CKMB, CKMBINDEX, TROPONINI in the last 168 hours. BNP (last 3 results) No results for input(s): PROBNP in the last 8760 hours. CBG: No results for input(s): GLUCAP in the last 168 hours.  Recent Results (from the past 240 hour(s))  Urine culture     Status: None   Collection Time: 12/25/14  5:56 PM  Result Value Ref Range Status   Specimen Description URINE, CLEAN CATCH  Final   Special Requests NONE  Final   Colony Count   Final    25,000 COLONIES/ML Performed at [facility]    Culture   Final    Multiple bacterial morphotypes present, none predominant. Suggest appropriate recollection if clinically indicated. Performed at [facility]    Report Status 12/26/2014 FINAL  Final  Blood culture (routine x 2)     Status: None (Preliminary result)   Collection Time: 12/25/14  6:00 PM  Result Value Ref Range Status   Specimen Description BLOOD ARM LEFT  Final   Special Requests BOTTLES DRAWN AEROBIC AND ANAEROBIC 10CC  Final   Culture   Final           BLOOD CULTURE RECEIVED NO GROWTH TO DATE CULTURE WILL BE HELD FOR 5 DAYS BEFORE ISSUING A FINAL NEGATIVE REPORT Performed at [facility]    Report Status PENDING  Incomplete  Blood culture (routine x 2)     Status: None (Preliminary result)   Collection Time: 12/25/14  6:15 PM  Result Value Ref Range Status   Specimen Description BLOOD ARM RIGHT  Final   Special Requests BOTTLES DRAWN AEROBIC AND ANAEROBIC 10CC  Final   Culture   Final           BLOOD CULTURE RECEIVED NO GROWTH TO DATE CULTURE WILL BE HELD FOR 5 DAYS BEFORE ISSUING A FINAL NEGATIVE REPORT Performed at [facility]    Report Status PENDING  Incomplete  MRSA PCR Screening     Status: None   Collection Time: 12/25/14 10:30 PM  Result Value Ref Range Status   MRSA by PCR NEGATIVE NEGATIVE Final     Comment:        The GeneXpert MRSA Assay (FDA approved for NASAL specimens only), is one component of a comprehensive MRSA colonization surveillance program. It is not intended to diagnose MRSA infection nor to guide or monitor treatment for MRSA infections.      Studies: Ct Abdomen Pelvis W Contrast  12/26/2014   CLINICAL DATA:  Metastatic adenocarcinoma  EXAM: CT ABDOMEN AND PELVIS WITH CONTRAST  TECHNIQUE: Multidetector CT imaging of the abdomen and pelvis was performed using the standard protocol following bolus administration of intravenous contrast.  CONTRAST:  113mL OMNIPAQUE IOHEXOL 300 MG/ML  SOLN  COMPARISON:  CT  scan of the chest 12/25/2014  FINDINGS: Again noted bilateral lung bases small nodules consistent with metastatic disease.  Sagittal images of the spine shows degenerative changes thoracolumbar spine. No destructive bony lesions are noted. No calcified gallstones are noted within gallbladder.  Again noted innumerable liver lesions consistent with metastatic disease. The largest lesion in right hepatic dome posteriorly measures 4.3 cm. Largest lesion and right hepatic dome anteriorly measures 2.8 cm. Largest lesion in left hepatic lobe anteriorly measures 1.9 cm.  A again noted nodular mass in right adrenal gland measures 2.7 cm highly suspicious for metastatic disease. There are scattered low-density lesions within right kidney the largest measures about 9 mm. Metastatic disease cannot be excluded. There is a probable cyst within left kidney posteriorly measures 1.2 cm. At least 2 ill-defined lesions are noted within lower pole of the left kidney the largest measures 7 mm suspicious for metastatic disease.  There is no hydronephrosis or hydroureter. Delayed renal images shows bilateral renal symmetrical excretion.  Bilateral visualized proximal ureter is unremarkable.  No small bowel obstruction.  No aortic aneurysm.  The terminal ileum is unremarkable. No pericecal inflammation.  There is significant irregular thickening of urinary bladder wall. Posterior scattered right posterior bladder wall measures at least 3 cm in thickness. Findings are highly suspicious for urinary bladder mass.  The left adrenal gland is unremarkable. The pancreas and spleen is unremarkable. No breast masses are identified in visualized breast tissue. There is no inguinal adenopathy.  The uterus is atrophic. No adnexal masses noted. Probable splenic cyst measures 9 mm.  IMPRESSION: 1. Again noted multiple pulmonary nodules lung bases consistent with metastatic disease. 2. Multiple nodular masses within liver consistent with metastatic disease. 3. Again noted right adrenal mass measures at least 2.7 cm highly suspicious for metastatic disease. 4. Bilateral small indeterminate low-density lesions bilateral kidneys. Although some of the lesions measured cysts metastatic disease cannot be excluded. Bilateral renal symmetrical excretion. No hydronephrosis or hydroureter. 5. No small bowel obstruction.  No ascites or free air. 6. There is irregular thickening of urinary bladder wall with mass like appearance in right posterior aspect of the urinary bladder. A urinary bladder mass is highly suspected. Correlation with urology exam and cystoscopy is recommended. 7. Atrophic uterus.  No inguinal adenopathy.  No adnexal mass.   Electronically Signed   By: Lahoma Crocker M.D.   On: 12/26/2014 18:21    Scheduled Meds: . cefTRIAXone (ROCEPHIN) IM  1 g Intramuscular Q24H  . heparin subcutaneous  5,000 Units Subcutaneous 3 times per day   Continuous Infusions: . sodium chloride 1,000 mL (12/26/14 1610)    Principal Problem:   Chest pain Active Problems:   Multiple lung nodules   Liver lesion   Adjustment disorder with mixed anxiety and depressed mood    Time spent: 35 minutes. Greater than 50% of this time was spent in direct contact with the patient coordinating care.    Lelon Frohlich  [facility] Pager [phone]  If 7PM-7AM, please contact night-coverage at [email or web address], password [facility] 12/28/2014, 3:23 PM  LOS: 3 days

## 2014-12-28 NOTE — Evaluation (Signed)
Physical Therapy Evaluation Patient Details Name: Taylor Foley MRN: [id number] DOB: 1951-07-27 Today's Date: 12/28/2014   History of Present Illness  63 y.o. female admitted with Right-Sided Chest and Shoulder Pain.  Found to have Metastatic Cancer, acute encephalopathy, and UTI.  Clinical Impression  Pt admitted with above diagnosis. Pt currently with functional limitations due to the deficits listed below (see PT Problem List). Ambulates up to 300 feet with a rolling walker, requiring intermittent assistance to prevent falls due to balance loss. Pt is easily distracted but appropriately follows instructions. She is a high fall risk with history of multiple falls at home (per pt she "blacks-out" all of the time.) Pt lives alone and does not have supervision to appropriately care for her at home. Pt will benefit from skilled PT to increase their independence and safety with mobility to allow discharge to the venue listed below.       Follow Up Recommendations SNF    Equipment Recommendations  None recommended by PT    Recommendations for Other Services       Precautions / Restrictions Precautions Precautions: Fall Restrictions Weight Bearing Restrictions: No      Mobility  Bed Mobility Overal bed mobility: Independent                Transfers Overall transfer level: Needs assistance Equipment used: Rolling walker (2 wheeled) Transfers: Sit to/from Stand Sit to Stand: Min guard         General transfer comment: Min guard for safety. Mildly unstable, improves with hand placement on RW. VC for hand placement to rise and sit.  Ambulation/Gait Ambulation/Gait assistance: Min assist Ambulation Distance (Feet): 300 Feet (x2) Assistive device: Rolling walker (2 wheeled) Gait Pattern/deviations: Step-through pattern;Decreased step length - right;Decreased stride length;Staggering left;Staggering right;Drifts right/left;Narrow base of support;Scissoring Gait  velocity: decreased   General Gait Details: Educated on safe DME use with a rolling walker. VC to widen base of support to prevent scissoring. Staggering Lt and Rt at times. Min assist frequently for loss of balance towards her Rt primarily. Improved slightly during second bout of ambulation. Poor control of RW with frequent drifting. Demonstrated to patient proper placement.  Stairs            Wheelchair Mobility    Modified Rankin (Stroke Patients Only)       Balance Overall balance assessment: Needs assistance;History of Falls Sitting-balance support: No upper extremity supported;Feet supported Sitting balance-Leahy Scale: Good     Standing balance support: No upper extremity supported Standing balance-Leahy Scale: Fair                               Pertinent Vitals/Pain Pain Assessment: No/denies pain Pain Intervention(s): Monitored during session    Home Living Family/patient expects to be discharged to:: Private residence Living Arrangements: Alone Available Help at Discharge: [place] Type of Home: House Home Access: Stairs to enter Entrance Stairs-Rails: None Technical brewer of Steps: 3 Home Layout: One level Home Equipment: Environmental consultant - 4 wheels      Prior Function Level of Independence: Independent with assistive device(s)         Comments: occasionally uses rollator to ambulate     Hand Dominance   Dominant Hand: Right    Extremity/Trunk Assessment   Upper Extremity Assessment: Defer to OT evaluation           Lower Extremity Assessment: Overall WFL for tasks assessed  Communication   Communication: No difficulties  Cognition Arousal/Alertness: Awake/alert Behavior During Therapy: WFL for tasks assessed/performed Overall Cognitive Status: Within Functional Limits for tasks assessed (Easily distracted)                      General Comments General comments (skin integrity, edema,  etc.): SpO2 98% on room air. HR 100 after ambulating    Exercises General Exercises - Lower Extremity Ankle Circles/Pumps: AROM;Both;10 reps;Seated Quad Sets: Strengthening;Both;10 reps;Seated      Assessment/Plan    PT Assessment Patient needs continued PT services  PT Diagnosis Difficulty walking;Abnormality of gait   PT Problem List Decreased activity tolerance;Decreased mobility;Decreased balance;Decreased cognition;Decreased coordination;Decreased knowledge of use of DME;Decreased safety awareness;Decreased knowledge of precautions  PT Treatment Interventions DME instruction;Gait training;Functional mobility training;Therapeutic activities;Therapeutic exercise;Balance training;Neuromuscular re-education;Patient/family education;Cognitive remediation   PT Goals (Current goals can be found in the Care Plan section) Acute Rehab PT Goals Patient Stated Goal: None stated PT Goal Formulation: With patient Time For Goal Achievement: 01/11/15 Potential to Achieve Goals: Fair    Frequency Min 3X/week   Barriers to discharge Decreased caregiver support lives alone. Per prior notes, pt does not adequately care for herself.    Co-evaluation               End of Session Equipment Utilized During Treatment: Gait belt Activity Tolerance: Patient tolerated treatment well Patient left: in chair;with call bell/phone within reach;with nursing/sitter in room Nurse Communication: Mobility status;Precautions         Time: 1020-1046 PT Time Calculation (min) (ACUTE ONLY): 26 min   Charges:   PT Evaluation $Initial PT Evaluation Tier I: 1 Procedure PT Treatments $Gait Training: 8-22 mins   PT G CodesEllouise Newer 12/28/2014, 12:28 PM Camille Bal [place], [place]

## 2014-12-29 ENCOUNTER — Other Ambulatory Visit: Payer: Self-pay | Admitting: Urology

## 2014-12-29 DIAGNOSIS — R410 Disorientation, unspecified: Secondary | ICD-10-CM | POA: Insufficient documentation

## 2014-12-29 DIAGNOSIS — Z515 Encounter for palliative care: Secondary | ICD-10-CM | POA: Insufficient documentation

## 2014-12-29 DIAGNOSIS — G893 Neoplasm related pain (acute) (chronic): Secondary | ICD-10-CM

## 2014-12-29 DIAGNOSIS — G9340 Encephalopathy, unspecified: Secondary | ICD-10-CM | POA: Insufficient documentation

## 2014-12-29 NOTE — Clinical Social Work Placement (Addendum)
    Clinical Social Work Department CLINICAL SOCIAL WORK PLACEMENT NOTE 12/29/2014  Patient:  CALEIGHA, ZALE  Account Number:  [id number] Admit date:  12/25/2014  Clinical Social Worker:  Adair Laundry  Date/time:  12/29/2014 11:32 AM  Clinical Social Work is seeking post-discharge placement for this patient at the following level of care:   [facility]   (*CSW will update this form in Epic as items are completed)   12/29/2014  Patient/family provided with [facility] Department of Clinical Social Work's list of facilities offering this level of care within the geographic area requested by the patient (or if unable, by the patient's family).  12/29/2014  Patient/family informed of their freedom to choose among providers that offer the needed level of care, that participate in Medicare, Medicaid or managed care program needed by the patient, have an available bed and are willing to accept the patient.  12/29/2014  Patient/family informed of [facility]' ownership interest in [facility], as well as of the fact that they are under no obligation to receive care at this facility.  PASARR submitted to EDS on 12/29/2014 PASARR number received on 12/29/2014  FL2 transmitted to all facilities in geographic area requested by pt/family on  12/29/2014 FL2 transmitted to all facilities within larger geographic area on 12/29/2014  Patient informed that his/her managed care company has contracts with or will negotiate with  certain facilities, including the following:     Patient/family informed of bed offers received:  12/30/2014 Patient chooses bed at [facility] Physician recommends and patient chooses bed at    Patient to be transferred to  [facility] on  12/31/2013 Patient to be transferred to facility by [facility] Patient and family notified of transfer on 12/31/2013  Name of family member notified:  Lenox Ponds  The following physician request were entered in  Epic: Physician Request  Please sign FL2.    Additional CommentsBerton Mount, [place]

## 2014-12-29 NOTE — Progress Notes (Signed)
CSW (Clinical Education officer, museum) spoke with pt about SNF. Pt confused about payment of SNF and expressed that she could not afford this. CSW notified pt that CSW can see if pt is eligible for letter of guarantee. CSW explained that this would mean hospital would pay for pt stay at SNF for ST Rehab. CSW also explained that letter of guarantee placement can be difficult and may require pt to dc outside of [facility]. Pt expressed understanding and agreeable at this time. Pt mostly able to follow conversation and did not appear confused. Pt asked appropriate questions. CSW unsure though of pt home situation or family support as this information was somewhat scattered when provided by pt. CSW will speak again with pt later today and ensure pt is still understanding of placement.  [place], [place]

## 2014-12-29 NOTE — Progress Notes (Signed)
[facility] PROGRESS NOTE  Taylor Foley DXI:[id number] DOB: 1951/11/24 DOA: 12/25/2014 PCP: PROVIDER NOT IN SYSTEM  Assessment/Plan: Right-Sided Chest and Shoulder Pain -Doubt ACS;  troponins negative, no acute ischemic changes on EKG. -Suspect may be from what appears to be metastatic bladder cancer. -Pain meds PRN.  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 -Appreciate GU consultation and recommendations (poor prognosis with and without treatment it appears). Dr. Alinda Money would like to proceed with cysto and biopsy next week and believes this will not only give [place] a diagnosis but help with her urinary symptoms as well. -Has no insight into her complex current medical issues. Will need to develop a plan between GU, palliative, psych and IM. Have requested a repeat psych eval today as I do not believe patient has medical decision making capacity and disagree with former psychiatric assessment. Dr. Lenna Sciara to see patient later today. Have also discussed with son Elta Guadeloupe, who will be present tomorrow to further discuss his mother's issues.  UTI -Cx with multiple morphotypes. -Will elect to treat with antibiotics given bladder findings on CT. -As patient has lost IV, will change to PO cipro for 1more days (day 2/5).  Acute Encephalopathy -CT Head negative. -No focal abnormalities on exam. -Unable to complete MRI Brain as she did not cooperate despite ativan given. -Discussed with son Elta Guadeloupe, and she has been displaying this odd behavior from some months now. -Seen by psych who has started an antidepressent, but believes she has decision-making capacity.  ? Cellulitis of the Lower Extremities -No redness evident on exam.  ?Murmur -No murmur on cardiac auscultation. -ECHO does not reveal any valvular abnormalities.  Social Issues -Patient appears unkempt and has not had access to medical care. -She admits today to not  having running water or a stove and was extremely disheveled on admission. -Strong concerns about safety of DC home. -Patient has agreed to SNF placement and SW is aware.  Code Status: Full Code Family Communication: Discussed with son Elta Guadeloupe via phone 1/22; have been unable to reach since. Disposition Plan: to be determined   Consultants:  GU  Palliative  Psychiatry   Antibiotics:  Cipro  Subjective: Currently calm, appears reasonable but has no insight into her disease process. Does not understand that she has incurable cancer, in fact tells me today that she had no idea about her diagnosis, despite me and multiple other providers mentioning this every day since admission.  Objective: Filed Vitals:   12/28/14 1600 12/28/14 1602 12/28/14 2036 12/29/14 1400  BP: 177/76 163/85 164/86 152/84  Pulse: 97  109 98  Temp: 99.1 F (37.3 C)  99.2 F (37.3 C) 98.1 F (36.7 C)  TempSrc: Oral  Oral Oral  Resp: 20  18 20   Height:      Weight:      SpO2: 98%  98% 100%    Intake/Output Summary (Last 24 hours) at 12/29/14 1655 Last data filed at 12/29/14 1220  Gross per 24 hour  Intake    560 ml  Output      0 ml  Net    560 ml   Filed Weights   12/25/14 2201 12/26/14 0423  Weight: 74.481 kg (164 lb 3.2 oz) 73.988 kg (163 lb 1.8 oz)    Exam:   General:   Awake, calm  Cardiovascular: RRR  Respiratory: CTA B  Abdomen: S/NT/ND/+BS  Extremities: 1-2+ edema bilaterally   Neurologic:  Non-focal  Data Reviewed: Basic Metabolic Panel:  Recent Labs Lab 12/25/14 1626 12/26/14 0830 12/27/14 0259  NA 137 139 137  K 4.2 4.1 4.1  CL 103 109 107  CO2 26 24 22   GLUCOSE 105* 92 94  BUN 18 14 12   CREATININE 1.13* 1.00 0.95  CALCIUM 9.0 8.1* 8.2*   Liver Function Tests:  Recent Labs Lab 12/26/14 0830 12/27/14 0259  AST 25 33  ALT 19 22  ALKPHOS 57 60  BILITOT 0.5 0.3  PROT 5.1* 5.2*  ALBUMIN 2.4* 2.4*   No results for input(s): LIPASE, AMYLASE in the last  168 hours. No results for input(s): AMMONIA in the last 168 hours. CBC:  Recent Labs Lab 12/25/14 1626 12/25/14 1811 12/27/14 0259  WBC 9.3 8.8 6.7  NEUTROABS  --  6.0  --   HGB 11.9* 11.3* 10.0*  HCT 36.8 34.7* 30.2*  MCV 85.0 84.2 84.6  PLT 392 345 252   Cardiac Enzymes: No results for input(s): CKTOTAL, CKMB, CKMBINDEX, TROPONINI in the last 168 hours. BNP (last 3 results) No results for input(s): PROBNP in the last 8760 hours. CBG: No results for input(s): GLUCAP in the last 168 hours.  Recent Results (from the past 240 hour(s))  Urine culture     Status: None   Collection Time: 12/25/14  5:56 PM  Result Value Ref Range Status   Specimen Description URINE, CLEAN CATCH  Final   Special Requests NONE  Final   Colony Count   Final    25,000 COLONIES/ML Performed at [facility]    Culture   Final    Multiple bacterial morphotypes present, none predominant. Suggest appropriate recollection if clinically indicated. Performed at [facility]    Report Status 12/26/2014 FINAL  Final  Blood culture (routine x 2)     Status: None (Preliminary result)   Collection Time: 12/25/14  6:00 PM  Result Value Ref Range Status   Specimen Description BLOOD ARM LEFT  Final   Special Requests BOTTLES DRAWN AEROBIC AND ANAEROBIC 10CC  Final   Culture   Final           BLOOD CULTURE RECEIVED NO GROWTH TO DATE CULTURE WILL BE HELD FOR 5 DAYS BEFORE ISSUING A FINAL NEGATIVE REPORT Performed at [facility]    Report Status PENDING  Incomplete  Blood culture (routine x 2)     Status: None (Preliminary result)   Collection Time: 12/25/14  6:15 PM  Result Value Ref Range Status   Specimen Description BLOOD ARM RIGHT  Final   Special Requests BOTTLES DRAWN AEROBIC AND ANAEROBIC 10CC  Final   Culture   Final           BLOOD CULTURE RECEIVED NO GROWTH TO DATE CULTURE WILL BE HELD FOR 5 DAYS BEFORE ISSUING A FINAL NEGATIVE REPORT Performed at [facility]      Report Status PENDING  Incomplete  MRSA PCR Screening     Status: None   Collection Time: 12/25/14 10:30 PM  Result Value Ref Range Status   MRSA by PCR NEGATIVE NEGATIVE Final    Comment:        The GeneXpert MRSA Assay (FDA approved for NASAL specimens only), is one component of a comprehensive MRSA colonization surveillance program. It is not intended to diagnose MRSA infection nor to guide or monitor treatment for MRSA infections.      Studies: No results found.  Scheduled Meds: . ciprofloxacin  500 mg Oral BID  .  heparin subcutaneous  5,000 Units Subcutaneous 3 times per day   Continuous Infusions: . sodium chloride 1,000 mL (12/26/14 4975)    Principal Problem:   Chest pain Active Problems:   Multiple lung nodules   Liver lesion   Adjustment disorder with mixed anxiety and depressed mood   Acute encephalopathy   Palliative care encounter   Cancer related pain   Confusion    Time spent: 35 minutes. Greater than 50% of this time was spent in direct contact with the patient coordinating care.    Lelon Frohlich  [facility] Pager [phone]  If 7PM-7AM, please contact night-coverage at [email or web address], password [facility] 12/29/2014, 4:55 PM  LOS: 4 days

## 2014-12-29 NOTE — Consult Note (Signed)
Patient Taylor Foley      DOB: 15-Feb-1951      WUG:[id number]     Consult Note from the Palliative Medicine Team at [place] Requested by: Dr Jerilee Hoh     PCP: PROVIDER NOT [place] Reason for [place]    Phone Number:None  Assessment/Recommendations: 64 yo female with presumed newly diagnosed bladder CA with extensive metastasis. Palliative consulted for goals of care  1.  Code Status: Full, did not discuss  2. GOC: Very difficult situation. Spoke with Dr Jerilee Hoh about case today.  If psych truly feels she has capacity to make decisions, I am not sure how we all can proceed.  Paislie does not recall any conversations about having potential bladder tumor/cancer, does not know what doctors have been telling her about being in hospital, etc. I am not sure how I can proceed with discussions around goals for potential cancer when she can not even remember conversations surrounding her cancer and what Urology has said.  I spoke with her son Elta Guadeloupe about these issues today. MRI brain ordered to look for brain mets.  Psych hopefully to re-evaluate today. Son may be able to come in at 4 tomorrow, but again if we are relying on Kennedie to make decisions about potential cancer, it feels very uncomfortable.  She has little or no knowledge about potential cancer,not sure how she could manipulate that information to make decisions.  Do we need to have multi-disciplinary meeting including psych, urology, palliative, patient?   3. Symptom Management:   1. Delirium/Encephalopathy- Son reports noting changes for past few months. Not sure why she is having issues here but he has not been able to see her due to weather. MRI to look for brain mets.  UTI being treated. Psych re-eval. Could some of this be related to normal pressure hydrocephalus? 2. Shoulder/Chest Pain- agree that this is likely cancer related. PRN oxycodone. Little use.   4. Psychosocial/Spiritual: Not  married. One son Elta Guadeloupe whom she requests is only person we give info to. Has a sister that helps a lot at home. Reportedly very functionally independent prior to hospital stay per son (cooking, cleaning on her own).    Brief HPI: 64 yo female with PMHx of HTN, HLD admitted on 1/21 with right sided chest/shoulder pain, hematuria, swollen/painful legs.  Workup here in hospital revealed concern for bladder mass with metastatic disease to lungs, liver, kidney/adrenals which would be consistent with metastatic bladder CA.   Urology following and she has not had a biopsy to this point.  She is also being treated for UTI.  She has displayed poor insight and odd behavior, so psych was consulted to evaluate.  Principal psychiatric diagnosis was adjustment d/o with mixed anxiety/depression. Through their evaluation, they also deemed her to have decision making capacity.  In speaking with Katessa today, she remembers meeting several doctors but not sure what they have said or what she is being treated for in hospital. When I ask her directly about concerns for tumor in her bladder and cancer, she has no recollection of this what so ever.  When I ask her about if this worries her, she tells me that she is just worried about when she can get home.  She again does not report concern about bladder tumor or awareness of potential cancer when I directly ask.       PMH:  Past Medical History  Diagnosis Date  . Hypertension   . Hypercholesteremia   .  Syncope and collapse 11/2011; 03/14/2013  . Shortness of breath     "sometimes lying down" (03/14/2013)  . Blood in the urine     "here lately; q time I go to pee" (03/14/2013)  . Arthritis     "all over" (03/24/2013)     PSH: Past Surgical History  Procedure Laterality Date  . Orif wrist fracture  12/04/2011    Procedure: OPEN REDUCTION INTERNAL FIXATION (ORIF) WRIST FRACTURE;  Surgeon: Marybelle Killings;  Location: [facility];  Service: Orthopedics;  Laterality: Left;    I have reviewed the [place] and SH and  If appropriate update it with new information. No Known Allergies Scheduled Meds: . ciprofloxacin  500 mg Oral BID  . heparin subcutaneous  5,000 Units Subcutaneous 3 times per day   Continuous Infusions: . sodium chloride 1,000 mL (12/26/14 0634)   PRN Meds:.oxyCODONE    BP 164/86 mmHg  Pulse 109  Temp(Src) 99.2 F (37.3 C) (Oral)  Resp 18  Ht 5\' 6"  (1.676 m)  Wt 73.988 kg (163 lb 1.8 oz)  BMI 26.34 kg/m2  SpO2 98%   PPS: ~70   Intake/Output Summary (Last 24 hours) at 12/29/14 1024 Last data filed at 12/29/14 0818  Gross per 24 hour  Intake    840 ml  Output      0 ml  Net    840 ml   Physical Exam:  General: Alert, NAD HEENT:  [place], sclera anicteric Chest:   CTAB CVS: mild tachy Abdomen: soft, ND Ext: edema Psych: pleasant, poor insight  Labs: CBC    Component Value Date/Time   WBC 6.7 12/27/2014 0259   RBC 3.57* 12/27/2014 0259   HGB 10.0* 12/27/2014 0259   HCT 30.2* 12/27/2014 0259   PLT 252 12/27/2014 0259   MCV 84.6 12/27/2014 0259   MCH 28.0 12/27/2014 0259   MCHC 33.1 12/27/2014 0259   RDW 13.5 12/27/2014 0259   LYMPHSABS 1.8 12/25/2014 1811   MONOABS 0.7 12/25/2014 1811   EOSABS 0.2 12/25/2014 1811   BASOSABS 0.0 12/25/2014 1811    BMET    Component Value Date/Time   NA 137 12/27/2014 0259   K 4.1 12/27/2014 0259   CL 107 12/27/2014 0259   CO2 22 12/27/2014 0259   GLUCOSE 94 12/27/2014 0259   BUN 12 12/27/2014 0259   CREATININE 0.95 12/27/2014 0259   CALCIUM 8.2* 12/27/2014 0259   GFRNONAA 62* 12/27/2014 0259   GFRAA 72* 12/27/2014 0259    CMP     Component Value Date/Time   NA 137 12/27/2014 0259   K 4.1 12/27/2014 0259   CL 107 12/27/2014 0259   CO2 22 12/27/2014 0259   GLUCOSE 94 12/27/2014 0259   BUN 12 12/27/2014 0259   CREATININE 0.95 12/27/2014 0259   CALCIUM 8.2* 12/27/2014 0259   PROT 5.2* 12/27/2014 0259   ALBUMIN 2.4* 12/27/2014 0259   AST 33 12/27/2014 0259   ALT 22  12/27/2014 0259   ALKPHOS 60 12/27/2014 0259   BILITOT 0.3 12/27/2014 0259   GFRNONAA 62* 12/27/2014 0259   GFRAA 72* 12/27/2014 0259   1/21 CT Head IMPRESSION: 1. No acute findings on this degraded study. 2. Mild progression of ventriculomegaly since 2014. 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 3. 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  1/22 CTA Chest IMPRESSION: 1. No evidence of pulmonary embolism. 2. Findings in the lungs and liver highly concerning for widespread metastatic disease. Given the cavitary nature of many of the  pulmonary nodules, a primary squamous cell carcinoma is suspected. It is possible that the cavitary pulmonary nodules could reflect widespread septic emboli, however, that is not favored, and would not account for the hepatic findings. 3. Right adrenal nodule also concerning for metastatic disease.  1/22 CT abd/pelvis IMPRESSION: 1. Again noted multiple pulmonary nodules lung bases consistent with metastatic disease. 2. Multiple nodular masses within liver consistent with metastatic disease. 3. Again noted right adrenal mass measures at least 2.7 cm highly suspicious for metastatic disease. 4. Bilateral small indeterminate low-density lesions bilateral kidneys. Although some of the lesions measured cysts metastatic disease cannot be excluded. Bilateral renal symmetrical excretion. No hydronephrosis or hydroureter. 5. No small bowel obstruction. No ascites or free air. 6. There is irregular thickening of urinary bladder wall with mass like appearance in right posterior aspect of the urinary bladder. A urinary bladder mass is highly suspected. Correlation with urology exam and cystoscopy is recommended. 7. Atrophic uterus. No inguinal adenopathy. No adnexal mass.   Total Time: 60 minutes Greater than 50%  of this time was spent counseling and coordinating care related to the above assessment and  plan.  [name] D.O. Palliative Medicine Team at [facility]  Pager: [phone] Team Phone: [phone]

## 2014-12-29 NOTE — Progress Notes (Signed)
Occupational Therapy Treatment Patient Details Name: Taylor Foley MRN: [id number] DOB: [birth year] Today's Date: 12/29/2014    History of present illness 64 y.o. female admitted with Right-Sided Chest and Shoulder Pain.  Found to have Metastatic Cancer, acute encephalopathy, and UTI.   OT comments  Discharge recommendations updated to SNF. Pt with decreased attention in session.  Follow Up Recommendations  SNF;Supervision/Assistance - 24 hour    Equipment Recommendations  Other (comment) (defer to next venue)    Recommendations for Other Services      Precautions / Restrictions Precautions Precautions: Fall Restrictions Weight Bearing Restrictions: No       Mobility Bed Mobility Overal bed mobility: Modified Independent                Transfers Overall transfer level: Needs assistance Equipment used: Rolling walker (2 wheeled) Transfers: Sit to/from Stand Sit to Stand: Min guard         General transfer comment: cues for hand placement and positioning of walker        ADL Overall ADL's : Needs assistance/impaired     Grooming: Applying deodorant;Standing;Set up;Supervision/safety;Wash/dry hands       Lower Body Bathing: Administrator, [employer];Set up (washed buttocks/peri area standing)   Upper Body Dressing : Set up;Supervision/safety;Sitting       Toilet Transfer: Min guard;Ambulation;RW;Regular Toilet;Grab bars   Toileting- Clothing Manipulation and Hygiene: Supervision/safety;Sit to/from stand       Functional mobility during ADLs: Min guard;Rolling walker General ADL Comments: Educated on UB dressing technique. Pt easily distracted in session. Discussed d/c recommendation.       Vision                     Perception     Praxis      Cognition  Awake/Alert Behavior During Therapy: WFL for tasks assessed/performed Overall Cognitive Status: Impaired/Different from baseline (unsure of baseline; decreased attention) Area of  Impairment: Attention;Memory;Safety/judgement   Current Attention Level: Sustained Memory: Decreased short-term memory    Safety/Judgement: Decreased awareness of safety          Extremity/Trunk Assessment               Exercises Other Exercises Other Exercises: performed a few reps of right shoulder flexion in supine-pt reported pain.    Shoulder Instructions       General Comments      Pertinent Vitals/ Pain       Pain Assessment: 0-10 Pain Score: 4  Pain Location: area under left breast; left leg; right shoulder with flexion Pain Intervention(s): Monitored during session  Home Living                                          Prior Functioning/Environment              Frequency Min 2X/week     Progress Toward Goals  OT Goals(current goals can now be found in the care plan section)  Progress towards OT goals: Progressing toward goals  Acute Rehab OT Goals Patient Stated Goal: not stated OT Goal Formulation: With patient Time For Goal Achievement: 01/02/15 Potential to Achieve Goals: Good  Plan Discharge plan needs to be updated    Co-evaluation                 End of Session Equipment Utilized During Treatment: Gait belt;Rolling walker   Activity Tolerance  Patient tolerated treatment well   Patient Left in bed;with call bell/phone within reach;with bed alarm set;with nursing/sitter in room;Other (comment) (MD present)   Nurse Communication          Time: 8757-9728 OT Time Calculation (min): 19 min  Charges: OT General Charges $OT Visit: 1 Procedure OT Treatments $Self Care/Home Management : 8-22 mins  Benito Mccreedy OTR/L [phone] 12/29/2014, 1:49 PM

## 2014-12-29 NOTE — Consult Note (Signed)
Psychiatry Consult follow-up note  Reason for Consult: Bizarre behavior, medication recomendation Referring Physician:  Erline Hau, MD  Patient Identification: Taylor Foley MRN:  [id number] Principal Diagnosis: Adjustment disorder with mixed anxiety and depression Diagnosis:   Patient Active Problem List   Diagnosis Date Noted  . Adjustment disorder with mixed anxiety and depressed mood [F43.23]   . Chest pain [R07.9] 12/25/2014  . Liver lesion [K76.89] 12/25/2014  . Multiple lung nodules [R91.8] 12/25/2014  . Syncope [R55] 03/14/2013  . HTN (hypertension) [I10] 03/14/2013    Total Time spent with patient: 30 minutes  Subjective:   Taylor Foley is a 64 y.o. female patient admitted with chest pain  HPI: Taylor Foley who reports that she came to the hospital yesterday due to dizziness and blackouts. However, information obtained  from her charts, provider and nurse  showed that she came to the hospital presenting with chest pain and was later discovered to have multiple lungs nodules on CT scan. Per report patient was also exhibiting unsafe behavior, agitation, paranoia and poor safety awareness with clear signs of psychosis yesterday. However, today, patient is alert and oriented to time,  place, and person. She denies any prior history of mental illness. She reports that she was living alone prior to her admission and her 74 year old son checks on her from time to time. Patient had a 12 grade education and was working with [employer] until 2 years ago. She reports financial problem but gets food stamp to get by. Patient does not have a cooking stove in her home and does not see her situation as a problem. It was reported that patient was not grooming herself at home and clearly has difficulty living alone. Patient appears anxious and depressed but denies any form of depression or anxiety. She denies delusional thinking or psychosis. Patient does no seems to  understand her current medical problem and  insist on talking to her son whom she expect to visit her today.  Interval history: Patient has a limited knowledge about her extensive complicated medical history especially cancer. Patient reported she presented with the right sided chest pain which is vague in nature. Patient reportedly started feeling better since she was admitted and treated with medication management. Patient is hoping to be discharged to skilled nursing facility with rehabilitation services for 30 days when she was medically stable. Patient has been compliant with her medication and not reported agitation, aggressive behavior or symptoms of psychosis. Patient has increased anxiety which is managed well with her medication. Patient can make decisions about her living arrangements but I doubt she can make decisions about complicated medical decisions based on lack of knowledge about her medical condition and treatment needs. Appreciate psychiatric consultation and will like to follow-up as clinically required.   Past Medical History:  Past Medical History  Diagnosis Date  . Hypertension   . Hypercholesteremia   . Syncope and collapse 11/2011; 03/14/2013  . Shortness of breath     "sometimes lying down" (03/14/2013)  . Blood in the urine     "here lately; q time I go to pee" (03/14/2013)  . Arthritis     "all over" (03/24/2013)    Past Surgical History  Procedure Laterality Date  . Orif wrist fracture  12/04/2011    Procedure: OPEN REDUCTION INTERNAL FIXATION (ORIF) WRIST FRACTURE;  Surgeon: Marybelle Killings;  Location: [facility];  Service: Orthopedics;  Laterality: Left;   Family History:  Family History  Problem Relation  Age of Onset  . Congestive Heart Failure Father   . Hypertension Father   . Hyperlipidemia Father   . Diabetes Other   . Heart disease Other   . Prostate cancer Father   . Ovarian cancer Mother   . Hypertension Mother    Social History:  History  Alcohol Use  No     History  Drug Use No    History   Social History  . Marital Status: Single    Spouse Name: N/A    Number of Children: N/A  . Years of Education: N/A   Social History Main Topics  . Smoking status: Never Smoker   . Smokeless tobacco: Never Used  . Alcohol Use: No  . Drug Use: No  . Sexual Activity: No   Other Topics Concern  . None   Social History Narrative   Additional Social History:                         Allergies:  No Known Allergies  Vitals: Blood pressure 164/86, pulse 109, temperature 99.2 F (37.3 C), temperature source Oral, resp. rate 18, height 5\' 6"  (1.676 m), weight 73.988 kg (163 lb 1.8 oz), SpO2 98 %.  Risk to Self: Is patient at risk for suicide?: No Risk to Others:   Prior Inpatient Therapy:   Prior Outpatient Therapy:    Current Facility-Administered Medications  Medication Dose Route Frequency Provider Last Rate Last Dose  . 0.9 %  sodium chloride infusion  1,000 mL Intravenous Continuous Kirstie Peri, MD 125 mL/hr at 12/26/14 0634 1,000 mL at 12/26/14 0634  . ciprofloxacin (CIPRO) tablet 500 mg  500 mg Oral BID Rhetta Mura Schorr, NP   500 mg at 12/29/14 0819  . heparin injection 5,000 Units  5,000 Units Subcutaneous 3 times per day Erline Hau, MD   5,000 Units at 12/28/14 1457  . oxyCODONE (Oxy IR/ROXICODONE) immediate release tablet 5 mg  5 mg Oral Q6H PRN Erline Hau, MD   5 mg at 12/26/14 1700    Musculoskeletal: Strength & Muscle Tone: within normal limits Gait & Station: normal Patient leans: N/A  Psychiatric Specialty Exam: Physical Exam  Review of Systems  Constitutional: Positive for malaise/fatigue.  Eyes: Negative.   Respiratory: Positive for shortness of breath.   Gastrointestinal: Negative.   Skin: Negative.   Neurological: Positive for dizziness and weakness.  Psychiatric/Behavioral: The patient is nervous/anxious.     Blood pressure 164/86, pulse 109, temperature 99.2 F  (37.3 C), temperature source Oral, resp. rate 18, height 5\' 6"  (1.676 m), weight 73.988 kg (163 lb 1.8 oz), SpO2 98 %.Body mass index is 26.34 kg/(m^2).  General Appearance: Casual  Eye Contact::  Good  Speech:  Clear and Coherent  Volume:  Normal  Mood:  Anxious  Affect:  Constricted and Depressed  Thought Process:  Goal Directed  Orientation:  Full (Time, Place, and Person)  Thought Content:  Negative  Suicidal Thoughts:  No  Homicidal Thoughts:  No  Memory:  Immediate;   Good Recent;   Good Remote;   Good  Judgement:  Fair  Insight:  Lacking  Psychomotor Activity:  Normal  Concentration:  Fair  Recall:  [place] of Knowledge:Good  Language: Good  Akathisia:  No  Handed:  Right  AIMS (if indicated):     Assets:  Communication Skills  ADL's:  Intact  Cognition: WNL  Sleep:      Medical  Decision Making: Established Problem, Stable/Improving (1) Patient seems to have capacity to make her medical decision for now, this may change in the future.   Treatment Plan Summary: Medication management: Continue Celexa 10mg  daily at bedtime for depression/anxiety. Continue Ativan as needed for agitation.  Plan/Recomendation:   No evidence of imminent risk to self or others at present.   Patient does not meet criteria for psychiatric inpatient admission. Social worker consult for out of home placement, current home is not a safe environment. Please contact family for health care proxy  Disposition: Please re-consult  Psychiatric service if necessary.  Durward Parcel., MD 12/29/2014 11:14 AM

## 2014-12-30 ENCOUNTER — Inpatient Hospital Stay (HOSPITAL_COMMUNITY): Payer: Medicaid Other

## 2014-12-30 MED ORDER — HYDROXYZINE HCL 25 MG PO TABS
50.0000 mg | ORAL_TABLET | Freq: Every day | ORAL | Status: DC
  Administered 2014-12-30: 50 mg via ORAL
  Filled 2014-12-30: qty 2

## 2014-12-30 NOTE — Progress Notes (Signed)
CSW (Clinical Education officer, museum) updated on plan of care by urology and palliative medicine physicians. CSW spoke with pt and pt son at bedside. Both confirmed that best and safest plan will be for pt to discharge to SNF. Pt son understanding that this is not a long term solution but CSW to reinforce this again at discharge. CSW spoke with attending physician and it was determined that pt will dc tomorrow. CSW updated facility.   [place], [place]

## 2014-12-30 NOTE — Progress Notes (Signed)
Patient ID: Taylor Foley, female   DOB: 06-Jul-1951, 64 y.o.   MRN: [id number]    Subjective: Pt seen today with son and with Dr. Deitra Mayo (palliative care) to discuss her situation.  She continues to have intermittent hematuria and urinary frequency/urgency.  Otherwise, she denies pain or complaints at this time.  She would like to know when she can leave the hospital.  Objective: Vital signs in last 24 hours: Temp:  [97.8 F (36.6 C)-98.3 F (36.8 C)] 97.8 F (36.6 C) (01/26 1433) Pulse Rate:  [92-98] 98 (01/26 1433) Resp:  [18-19] 19 (01/26 1433) BP: (149-174)/(91-94) 149/91 mmHg (01/26 1433) SpO2:  [100 %] 100 % (01/26 1433)  Intake/Output from previous day: 01/25 0701 - 01/26 0700 In: 680 [P.O.:680] Out: -  Intake/Output this shift:    Physical Exam:  General: Alert and oriented to place and time Abd: Soft, NT, No CVAT    Studies/Results: Mr Brain Wo Contrast  12/30/2014   CLINICAL DATA:  Acute encephalopathy. Metastatic disease. Confusion.  EXAM: MRI HEAD WITHOUT CONTRAST  TECHNIQUE: Multiplanar, multiecho pulse sequences of the brain and surrounding structures were obtained without intravenous contrast.  COMPARISON:  CT head 12/25/2014  FINDINGS: Ventricles are diffusely dilated and unchanged from the recent CT. There is moderate ventricular dilatation involving the third, fourth and lateral ventricles. There is underlying cerebral atrophy. 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  Negative for acute infarct.  Chronic microvascular ischemic changes in the white matter. 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 Mild chronic microvascular ischemia in the pons. Chronic infarct left parietal lobe unchanged from the CT of 03/14/2013.  Negative for hemorrhage or fluid collection  Negative for mass or edema. Negative for metastatic disease on unenhanced images. Image quality degraded by motion.  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 Evaluate clinically for normal pressure hydrocephalus symptoms.  Chronic microvascular ischemic change. No acute infarct or mass on unenhanced images.   Electronically Signed   By: Franchot Gallo M.D.   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 Her MRI was negative for metastatic disease to the brain. I do not think she is capable to make informed medical decisions about her care considering her obvious difficulties with memory/comprehension of her situation. She is agreeable to give healthcare power of attorney to her son and her son agrees to be her power of attorney.  Dr. Deitra Mayo will attempt to see if this can be officially addressed today since her son is present.  She would benefit from cystoscopy under anesthesia and transurethral resection of her bladder tumor for diagnostic and palliative purposes.  I reviewed this procedure in detail today with her and her son. We discussed potential risks/benefitis, complications, expected recovery process, etc.  She and her son agree to proceed.  This has been tentatively scheduled for next Monday morning.  She is apparently going to be discharged to a SNF which I think is very appropriate anytime from a urologic standpoint.    If she is confirmed to have a pathologic diagnosis of bladder cancer, her prognosis considering the presence of metastatic disease is poor and her options will include palliative care vs systemic chemotherapy (which can delay progression and symptoms but is not curative).  Following her TURBT, she and her son likely will need further consultation with  palliative care and medical oncology.   LOS: 5 days   Montae Stager,LES 12/30/2014, 5:45 PM

## 2014-12-30 NOTE — Consult Note (Signed)
Psychiatry Consult follow-up note  Reason for Consult: Bizarre behavior, medication recomendation Referring Physician:  Erline Hau, MD  Patient Identification: Taylor Foley MRN:  [id number] Principal Diagnosis: Adjustment disorder with mixed anxiety and depression Diagnosis:   Patient Active Problem List   Diagnosis Date Noted  . Acute encephalopathy [G93.40]   . Palliative care encounter [Z51.5]   . Cancer related pain [G89.3]   . Confusion [R41.0]   . Adjustment disorder with mixed anxiety and depressed mood [F43.23]   . Chest pain [R07.9] 12/25/2014  . Liver lesion [K76.89] 12/25/2014  . Multiple lung nodules [R91.8] 12/25/2014  . Syncope [R55] 03/14/2013  . HTN (hypertension) [I10] 03/14/2013    Total Time spent with patient: 30 minutes  Subjective:   Taylor Foley is a 64 y.o. female patient admitted with chest pain  HPI: Taylor Foley who reports that she came to the hospital yesterday due to dizziness and blackouts. However, information obtained  from her charts, provider and nurse  showed that she came to the hospital presenting with chest pain and was later discovered to have multiple lungs nodules on CT scan. Per report patient was also exhibiting unsafe behavior, agitation, paranoia and poor safety awareness with clear signs of psychosis yesterday. However, today, patient is alert and oriented to time,  place, and person. She denies any prior history of mental illness. She reports that she was living alone prior to her admission and her 49 year old son checks on her from time to time. Patient had a 12 grade education and was working with [employer] until 2 years ago. She reports financial problem but gets food stamp to get by. Patient does not have a cooking stove in her home and does not see her situation as a problem. It was reported that patient was not grooming herself at home and clearly has difficulty living alone. Patient appears anxious and  depressed but denies any form of depression or anxiety. She denies delusional thinking or psychosis. Patient does no seems to understand her current medical problem and  insist on talking to her son whom she expect to visit her today.  Interval history: Patient has no complaints today but she continued to be limited without knowledge about extensive medical problems and treatment needs. Physical therapy and occupation therapy is working with her today and she was walking around without difficulties or distress. Patient reportedly did not sleep well last night and requested some assistance with medication. Patient stated her son is coming today at 3 PM and trying to make appropriate disposition plans and also rehabilitation when medically stable. Patient can make decisions about her living arrangements but I doubt she can make decisions about complicated medical decisions based on lack of knowledge about her medical condition and treatment needs. Appreciate psychiatric consultation and will like to follow-up as clinically required.   Past Medical History:  Past Medical History  Diagnosis Date  . Hypertension   . Hypercholesteremia   . Syncope and collapse 11/2011; 03/14/2013  . Shortness of breath     "sometimes lying down" (03/14/2013)  . Blood in the urine     "here lately; q time I go to pee" (03/14/2013)  . Arthritis     "all over" (03/24/2013)    Past Surgical History  Procedure Laterality Date  . Orif wrist fracture  12/04/2011    Procedure: OPEN REDUCTION INTERNAL FIXATION (ORIF) WRIST FRACTURE;  Surgeon: Marybelle Killings;  Location: [facility];  Service: Orthopedics;  Laterality: Left;  Family History:  Family History  Problem Relation Age of Onset  . Congestive Heart Failure Father   . Hypertension Father   . Hyperlipidemia Father   . Diabetes Other   . Heart disease Other   . Prostate cancer Father   . Ovarian cancer Mother   . Hypertension Mother    Social History:  History   Alcohol Use No     History  Drug Use No    History   Social History  . Marital Status: Single    Spouse Name: N/A    Number of Children: N/A  . Years of Education: N/A   Social History Main Topics  . Smoking status: Never Smoker   . Smokeless tobacco: Never Used  . Alcohol Use: No  . Drug Use: No  . Sexual Activity: No   Other Topics Concern  . None   Social History Narrative   Additional Social History:                         Allergies:  No Known Allergies  Vitals: Blood pressure 149/91, pulse 98, temperature 97.8 F (36.6 C), temperature source Oral, resp. rate 19, height 5\' 6"  (1.676 m), weight 73.988 kg (163 lb 1.8 oz), SpO2 100 %.  Risk to Self: Is patient at risk for suicide?: No Risk to Others:   Prior Inpatient Therapy:   Prior Outpatient Therapy:    Current Facility-Administered Medications  Medication Dose Route Frequency Provider Last Rate Last Dose  . 0.9 %  sodium chloride infusion  1,000 mL Intravenous Continuous Kirstie Peri, MD 125 mL/hr at 12/26/14 0634 1,000 mL at 12/26/14 0634  . ciprofloxacin (CIPRO) tablet 500 mg  500 mg Oral BID Rhetta Mura Schorr, NP   500 mg at 12/30/14 0754  . heparin injection 5,000 Units  5,000 Units Subcutaneous 3 times per day Erline Hau, MD   5,000 Units at 12/28/14 1457  . oxyCODONE (Oxy IR/ROXICODONE) immediate release tablet 5 mg  5 mg Oral Q6H PRN Erline Hau, MD   5 mg at 12/26/14 1700    Musculoskeletal: Strength & Muscle Tone: within normal limits Gait & Station: normal Patient leans: N/A  Psychiatric Specialty Exam: Physical Exam  Review of Systems  Constitutional: Positive for malaise/fatigue.  Eyes: Negative.   Respiratory: Positive for shortness of breath.   Gastrointestinal: Negative.   Skin: Negative.   Neurological: Positive for dizziness and weakness.  Psychiatric/Behavioral: The patient is nervous/anxious.     Blood pressure 149/91, pulse 98,  temperature 97.8 F (36.6 C), temperature source Oral, resp. rate 19, height 5\' 6"  (1.676 m), weight 73.988 kg (163 lb 1.8 oz), SpO2 100 %.Body mass index is 26.34 kg/(m^2).  General Appearance: Casual  Eye Contact::  Good  Speech:  Clear and Coherent  Volume:  Normal  Mood:  Anxious  Affect:  Constricted and Depressed  Thought Process:  Goal Directed  Orientation:  Full (Time, Place, and Person)  Thought Content:  Negative  Suicidal Thoughts:  No  Homicidal Thoughts:  No  Memory:  Immediate;   Good Recent;   Good Remote;   Good  Judgement:  Fair  Insight:  Lacking  Psychomotor Activity:  Normal  Concentration:  Fair  Recall:  [place] of Knowledge:Good  Language: Good  Akathisia:  No  Handed:  Right  AIMS (if indicated):     Assets:  Communication Skills  ADL's:  Intact  Cognition: WNL  Sleep:      Medical Decision Making: Established Problem, Stable/Improving (1) Patient seems to have capacity to make her medical decision for now, this may change in the future.   Treatment Plan Summary: Medication management: Continue Celexa 10mg  daily at bedtime for depression/anxiety. Continue Ativan as needed for agitation.  Plan/Recomendation:   No evidence of imminent risk to self or others at present.   Patient does not meet criteria for psychiatric inpatient admission. Social worker consult for out of home placement, current home is not a safe environment. Please contact family for health care proxy  Disposition: Please re-consult  Psychiatric service if necessary.  Durward Parcel., MD 12/30/2014 4:37 PM

## 2014-12-30 NOTE — Progress Notes (Signed)
[facility] PROGRESS NOTE  Assessment/Plan: Right-Sided Chest and Shoulder Pain - Unlikely ACS; troponins negative, no acute ischemic changes on EKG. - Possible du to metastatic bladder cancer. - Cont Pain meds PRN.  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 - Dr. Alinda Money would like to proceed with cysto and biopsy next week and believes this will not only give [place] a diagnosis but help with her urinary symptoms. - Psyq re consulted recommended to contact family for healthcare by proxy. - MRI brain pending.  UTI - Cx with multiple morphotypes. - Will elect to treat with antibiotics given bladder findings on CT. - Cont PO cipro for 38more days (day 3/5).  Acute Encephalopathy - CT Head negative. - No focal abnormalities on exam. - Unable to complete MRI Brain as she did not cooperate despite ativan given. Will repeat  MRI with sedation to rule out mets to brain. - Psych believes she has decision-making capacity, but not medical capacity.  Social Issues - She relates to not having running water or a stove and was extremely disheveled on admission. - Concerns about safety of DC home. - Patient has agreed to SNF placement and SW is aware.  Code Status: Full Code Family Communication: Discussed with son Elta [place]. Disposition Plan: SNF when bed available    Consultants:  Syq.  Procedures:  CT head  Ct angio  Ct abd and pelvis  Echo   Antibiotics:  cipro  HPI/Subjective: No complains  Objective: Filed Vitals:   12/28/14 1602 12/28/14 2036 12/29/14 1400 12/29/14 2210  BP: 163/85 164/86 152/84 174/94  Pulse:  109 98 92  Temp:  99.2 F (37.3 C) 98.1 F (36.7 C) 98.3 F (36.8 C)  TempSrc:  Oral Oral Oral  Resp:  18 20 18   Height:      Weight:      SpO2:  98% 100% 100%    Intake/Output Summary (Last 24 hours) at 12/30/14 1056 Last data filed at 12/29/14 1803  Gross per 24 hour  Intake    560 ml  Output       0 ml  Net    560 ml   Filed Weights   12/25/14 2201 12/26/14 0423  Weight: 74.481 kg (164 lb 3.2 oz) 73.988 kg (163 lb 1.8 oz)    Exam:  General: Alert, awake, oriented x3, in no acute distress.  HEENT: No bruits, no goiter.  Heart: Regular rate and rhythm. Lungs: Good air movement, clear Abdomen: Soft, nontender, nondistended, positive bowel sounds.  Neuro: Grossly intact, nonfocal.   Data Reviewed: Basic Metabolic Panel:  Recent Labs Lab 12/25/14 1626 12/26/14 0830 12/27/14 0259  NA 137 139 137  K 4.2 4.1 4.1  CL 103 109 107  CO2 26 24 22   GLUCOSE 105* 92 94  BUN 18 14 12   CREATININE 1.13* 1.00 0.95  CALCIUM 9.0 8.1* 8.2*   Liver Function Tests:  Recent Labs Lab 12/26/14 0830 12/27/14 0259  AST 25 33  ALT 19 22  ALKPHOS 57 60  BILITOT 0.5 0.3  PROT 5.1* 5.2*  ALBUMIN 2.4* 2.4*   No results for input(s): LIPASE, AMYLASE in the last 168 hours. No results for input(s): AMMONIA in the last 168 hours. CBC:  Recent Labs Lab 12/25/14 1626 12/25/14 1811 12/27/14 0259  WBC 9.3 8.8 6.7  NEUTROABS  --  6.0  --   HGB 11.9* 11.3* 10.0*  HCT 36.8 34.7* 30.2*  MCV 85.0 84.2 84.6  PLT 392 345 252   Cardiac Enzymes: No results for input(s): CKTOTAL, CKMB, CKMBINDEX, TROPONINI in the last 168 hours. BNP (last 3 results) No results for input(s): PROBNP in the last 8760 hours. CBG: No results for input(s): GLUCAP in the last 168 hours.  Recent Results (from the past 240 hour(s))  Urine culture     Status: None   Collection Time: 12/25/14  5:56 PM  Result Value Ref Range Status   Specimen Description URINE, CLEAN CATCH  Final   Special Requests NONE  Final   Colony Count   Final    25,000 COLONIES/ML Performed at [facility]    Culture   Final    Multiple bacterial morphotypes present, none predominant. Suggest appropriate recollection if clinically indicated. Performed at [facility]    Report Status 12/26/2014 FINAL  Final  Blood  culture (routine x 2)     Status: None (Preliminary result)   Collection Time: 12/25/14  6:00 PM  Result Value Ref Range Status   Specimen Description BLOOD ARM LEFT  Final   Special Requests BOTTLES DRAWN AEROBIC AND ANAEROBIC 10CC  Final   Culture   Final           BLOOD CULTURE RECEIVED NO GROWTH TO DATE CULTURE WILL BE HELD FOR 5 DAYS BEFORE ISSUING A FINAL NEGATIVE REPORT Performed at [facility]    Report Status PENDING  Incomplete  Blood culture (routine x 2)     Status: None (Preliminary result)   Collection Time: 12/25/14  6:15 PM  Result Value Ref Range Status   Specimen Description BLOOD ARM RIGHT  Final   Special Requests BOTTLES DRAWN AEROBIC AND ANAEROBIC 10CC  Final   Culture   Final           BLOOD CULTURE RECEIVED NO GROWTH TO DATE CULTURE WILL BE HELD FOR 5 DAYS BEFORE ISSUING A FINAL NEGATIVE REPORT Performed at [facility]    Report Status PENDING  Incomplete  MRSA PCR Screening     Status: None   Collection Time: 12/25/14 10:30 PM  Result Value Ref Range Status   MRSA by PCR NEGATIVE NEGATIVE Final    Comment:        The GeneXpert MRSA Assay (FDA approved for NASAL specimens only), is one component of a comprehensive MRSA colonization surveillance program. It is not intended to diagnose MRSA infection nor to guide or monitor treatment for MRSA infections.      Studies: No results found.  Scheduled Meds: . ciprofloxacin  500 mg Oral BID  . heparin subcutaneous  5,000 Units Subcutaneous 3 times per day   Continuous Infusions: . sodium chloride 1,000 mL (12/26/14 0634)     Charlynne Cousins  [facility] Pager [phone]. If 8PM-8AM, please contact night-coverage at [email or web address], password [facility] 12/30/2014, 10:56 AM  LOS: 5 days

## 2014-12-30 NOTE — Progress Notes (Signed)
Physical Therapy Treatment Patient Details Name: Taylor Foley MRN: [id number] DOB: 05/18/1951 Today's Date: 12/30/2014    History of Present Illness 64 y.o. female admitted with Right-Sided Chest and Shoulder Pain.  Found to have Metastatic Cancer, acute encephalopathy, and UTI.    PT Comments    Pt was seen for updating of her progress with gait, and have continued to find reason to transition to SNF.  Pt is unsafe walking and reluctant to use AD.  With her current cognitive status, she will pose asubstantial fall risk at home alone.  Will continue to recommend SNF but no equipment needs are identified yet.  Follow Up Recommendations  SNF     Equipment Recommendations  None recommended by PT    Recommendations for Other Services       Precautions / Restrictions Precautions Precautions: Fall Restrictions Weight Bearing Restrictions: No    Mobility  Bed Mobility               General bed mobility comments: up when PT entered  Transfers Overall transfer level: Needs assistance Equipment used: 1 person hand held assist Transfers: Sit to/from Stand Sit to Stand: Supervision;Min guard         General transfer comment: reminded pt about safety and hand placement  Ambulation/Gait Ambulation/Gait assistance: Min guard;Min assist Ambulation Distance (Feet): 250 Feet (x 2) Assistive device: 1 person hand held assist Gait Pattern/deviations: Step-through pattern;Decreased stride length;Drifts right/left;Wide base of support;Shuffle Gait velocity: decreased Gait velocity interpretation: Below normal speed for age/gender General Gait Details: Pt handled HHA well, with pt following the PT and using tactile information well to control her balance and gait.  Nursing has been allowing pt to walk with no AD, which she feels is appropriate now.   Stairs            Wheelchair Mobility    Modified Rankin (Stroke Patients Only)       Balance Overall balance  assessment: Needs assistance Sitting-balance support: Feet supported Sitting balance-Leahy Scale: Good     Standing balance support: Single extremity supported;No upper extremity supported;During functional activity Standing balance-Leahy Scale: Fair Standing balance comment: dynamic standing balance is less stable, with fair- control and tends to walk in a squatty position to compensate.               High level balance activites: Side stepping;Braiding;Backward walking;Direction changes;Turns High Level Balance Comments: LOB for tandem walking and backwardwalking, with pt somewhat apprehensive about the two activities    Cognition Arousal/Alertness: Awake/alert Behavior During Therapy: WFL for tasks assessed/performed Overall Cognitive Status: Impaired/Different from baseline Area of Impairment: Attention;Memory;Safety/judgement;Problem solving   Current Attention Level: Divided Memory: Decreased short-term memory   Safety/Judgement: Decreased awareness of safety   Problem Solving: Slow processing;Requires verbal cues      Exercises      General Comments General comments (skin integrity, edema, etc.): Pt is walking with no AD on the hallway with nursing report to PT that she is fine.  Pt is using many compensatory strategies to keep her balance, mainly with dropping her COM and using handhold on rails, counters, furniture.      Pertinent Vitals/Pain Pain Assessment: No/denies pain  BP was 174/94, pulse 92 and O2 sats 100% on room air.    Home Living                      Prior Function            PT  Goals (current goals can now be found in the care plan section) Acute Rehab PT Goals Patient Stated Goal: not stated Progress towards PT goals: Progressing toward goals    Frequency  Min 3X/week    PT Plan Current plan remains appropriate    Co-evaluation             End of Session   Activity Tolerance: Patient tolerated treatment well Patient  left: in chair;with call bell/phone within reach     Time: 8882-8003 PT Time Calculation (min) (ACUTE ONLY): 30 min  Charges:  $Gait Training: 8-22 mins $Neuromuscular Re-education: 8-22 mins                    G Codes:      Ramond Dial 25-Jan-2015, 2:48 PM   Mee Hives, PT MS Acute Rehab Dept. Number: [phone]

## 2014-12-30 NOTE — Progress Notes (Signed)
Met with Dr Taylor Foley, son Taylor Foley, patient today.  Dr Taylor Foley able to discuss situation with potential bladder CA and possible intervention as outlined.  She continues to display poor understanding and even had a hard time repeating back planned interventions and issues few minutes after detailed explanation by Dr Taylor Foley.  I think having her son Taylor Foley present is key.  Plan for TURBT procedure on Monday which Taylor Foley too, and son also wishes to proceed with.  In discussions today, I think when we have surgical pathology, they will need to follow-up with medical oncology to hear risk/benefits of treatment.  We did talk some about option of comfort care if decision is not to pursue systemic therapy.  We do not have outpatient clinic at this time, but I did give Taylor Foley our contact information.  I have consulted spiritual care to create HCPOA [name] wishes this to be Taylor Foley) before they leave hospital.  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  If her cognitive impairment, gait issues, urinary frequency are from NPH, hard to believe she would benefit from ventricular drain if as suspected she has metastatic bladder CA> son Taylor Foley agrees and will need to evaluate things after procedure.  Plan to go to SNF tomorrow. I am off service tomorrow but feel free to contact our team if additional questions or concerns arise.   [name] D.O. Palliative Medicine Team at [facility]  Pager: [phone] Team Phone: [phone]

## 2014-12-31 ENCOUNTER — Inpatient Hospital Stay (HOSPITAL_COMMUNITY): Payer: Medicaid Other

## 2014-12-31 LAB — URIC ACID: Uric Acid, Serum: 3.5 mg/dL (ref 2.4–7.0)

## 2014-12-31 LAB — TSH: TSH: 1.986 u[IU]/mL (ref 0.350–4.500)

## 2014-12-31 LAB — VITAMIN B12: Vitamin B-12: 674 pg/mL (ref 211–911)

## 2014-12-31 MED ORDER — PREDNISONE 20 MG PO TABS: 50.0000 mg | ORAL_TABLET | Freq: Every day | ORAL | Status: DC

## 2014-12-31 MED ORDER — METHYLPREDNISOLONE 4 MG PO KIT: PACK | ORAL | Status: AC

## 2014-12-31 MED ORDER — IBUPROFEN 600 MG PO TABS: 600.0000 mg | ORAL_TABLET | Freq: Three times a day (TID) | ORAL | Status: AC | PRN

## 2014-12-31 MED ORDER — CIPROFLOXACIN HCL 500 MG PO TABS: 500.0000 mg | ORAL_TABLET | Freq: Two times a day (BID) | ORAL | Status: DC

## 2014-12-31 NOTE — Consult Note (Signed)
NEURO HOSPITALIST CONSULT NOTE    Reason for Consult: ventriculomegaly noted on MRI with question of possible NPH  HPI:                                                                                                                                          Taylor Foley is an 64 y.o. female presenting to hospital for right sided chest pain.  While hospitalized she was newly diagnosed with UTI and stage IV bladder cancer.  Urology has consulted and planning on proceeding with cysto and Bx next week. Due to agitation and intermittent confusion MRI brain was obtained.  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  Neurology was consulted to evaluate. On exam patient states she has had no urinary incontinence and feels her memory has not declined. She feels her gait is off at times but attributes this to her arthritis that causes significant back pain and knee pain.   Past Medical History  Diagnosis Date  . Hypertension   . Hypercholesteremia   . Syncope and collapse 11/2011; 03/14/2013  . Shortness of breath     "sometimes lying down" (03/14/2013)  . Blood in the urine     "here lately; q time I go to pee" (03/14/2013)  . Arthritis     "all over" (03/24/2013)    Past Surgical History  Procedure Laterality Date  . Orif wrist fracture  12/04/2011    Procedure: OPEN REDUCTION INTERNAL FIXATION (ORIF) WRIST FRACTURE;  Surgeon: Marybelle Killings;  Location: [facility];  Service: Orthopedics;  Laterality: Left;    Family History  Problem Relation Age of Onset  . Congestive Heart Failure Father   . Hypertension Father   . Hyperlipidemia Father   . Diabetes Other   . Heart disease Other   . Prostate cancer Father   . Ovarian cancer Mother   . Hypertension Mother     Social History:  reports that she has never smoked. She has never used smokeless tobacco. She reports  that she does not drink alcohol or use illicit drugs.  No Known Allergies  MEDICATIONS:  Prior to Admission:  No prescriptions prior to admission   Scheduled: . ciprofloxacin  500 mg Oral BID  . heparin subcutaneous  5,000 Units Subcutaneous 3 times per day  . hydrOXYzine  50 mg Oral QHS   Continuous:  [id number]   ROS:                                                                                                                                       History obtained from the patient  General ROS: negative for - chills, fatigue, fever, night sweats, weight gain or weight loss Psychological ROS: negative for - behavioral disorder, hallucinations, memory difficulties, mood swings or suicidal ideation Ophthalmic ROS: negative for - blurry vision, double vision, eye pain or loss of vision ENT ROS: negative for - epistaxis, nasal discharge, oral lesions, sore throat, tinnitus or vertigo Allergy and Immunology ROS: negative for - hives or itchy/watery eyes Hematological and Lymphatic ROS: negative for - bleeding problems, bruising or swollen lymph nodes Endocrine ROS: negative for - galactorrhea, hair pattern changes, polydipsia/polyuria or temperature intolerance Respiratory ROS: negative for - cough, hemoptysis, shortness of breath or wheezing Cardiovascular ROS: negative for - chest pain, dyspnea on exertion, edema or irregular heartbeat Gastrointestinal ROS: negative for - abdominal pain, diarrhea, hematemesis, nausea/vomiting or stool incontinence Genito-Urinary ROS: negative for - dysuria, hematuria, incontinence or urinary frequency/urgency Musculoskeletal ROS: negative for - joint swelling or muscular weakness Neurological ROS: as noted in HPI Dermatological ROS: negative for rash and skin lesion changes   Blood pressure 147/79, pulse 87, temperature 98.7 F  (37.1 C), temperature source Oral, resp. rate 20, height 5\' 6"  (1.676 m), weight 73.573 kg (162 lb 3.2 oz), SpO2 99 %.   Neurologic Examination:                                                                                                      HEENT-  Normocephalic, no lesions, without obvious abnormality.  Normal external eye and conjunctiva.  Normal TM's bilaterally.  Normal auditory canals and external ears. Normal external nose, mucus membranes and septum.  Normal pharynx. Cardiovascular- S1, S2 normal, pulses palpable throughout   Lungs- no tachypnea, retractions or cyanosis Abdomen- normal findings: bowel sounds normal Extremities- no edema Lymph-no adenopathy palpable Musculoskeletal-no joint tenderness, deformity or swelling Skin-warm and dry, no hyperpigmentation, vitiligo, or suspicious lesions  Neurological Examination Mental Status: Alert, oriented, thought content appropriate.  Speech fluent without evidence of aphasia.  Able to follow 3 step commands  without difficulty. Cranial Nerves: II: Discs flat bilaterally; Visual fields grossly normal, pupils equal, round, reactive to light and accommodation III,IV, VI: ptosis not present, extra-ocular motions intact bilaterally V,VII: smile symmetric, facial light touch sensation normal bilaterally VIII: hearing normal bilaterally IX,X: gag reflex present XI: bilateral shoulder shrug XII: midline tongue extension Motor: Right : Upper extremity   5/5    Left:     Upper extremity   5/5  Lower extremity   5/5     Lower extremity   5/5 Tone and bulk:normal tone throughout; no atrophy noted Sensory: Pinprick and light touch intact throughout with mild decreased sensation to LT and PP in bilateral feet Deep Tendon Reflexes: 2+ and symmetric throughout UE and KJ. No AJ Plantars: Right: downgoing   Left: downgoing Cerebellar: normal finger-to-nose,  normal heel-to-shin test Gait: small shuffling steps with no retropulsion. Gait is  limited by back discomfort.     Lab Results: Basic Metabolic Panel:  Recent Labs Lab 12/25/14 1626 12/26/14 0830 12/27/14 0259  NA 137 139 137  K 4.2 4.1 4.1  CL 103 109 107  CO2 26 24 22   GLUCOSE 105* 92 94  BUN 18 14 12   CREATININE 1.13* 1.00 0.95  CALCIUM 9.0 8.1* 8.2*    Liver Function Tests:  Recent Labs Lab 12/26/14 0830 12/27/14 0259  AST 25 33  ALT 19 22  ALKPHOS 57 60  BILITOT 0.5 0.3  PROT 5.1* 5.2*  ALBUMIN 2.4* 2.4*   No results for input(s): LIPASE, AMYLASE in the last 168 hours. No results for input(s): AMMONIA in the last 168 hours.  CBC:  Recent Labs Lab 12/25/14 1626 12/25/14 1811 12/27/14 0259  WBC 9.3 8.8 6.7  NEUTROABS  --  6.0  --   HGB 11.9* 11.3* 10.0*  HCT 36.8 34.7* 30.2*  MCV 85.0 84.2 84.6  PLT 392 345 252    Cardiac Enzymes: No results for input(s): CKTOTAL, CKMB, CKMBINDEX, TROPONINI in the last 168 hours.  Lipid Panel: No results for input(s): CHOL, TRIG, HDL, CHOLHDL, VLDL, LDLCALC in the last 168 hours.  CBG: No results for input(s): GLUCAP in the last 168 hours.  Microbiology: Results for orders placed or performed during the hospital encounter of 12/25/14  Urine culture     Status: None   Collection Time: 12/25/14  5:56 PM  Result Value Ref Range Status   Specimen Description URINE, CLEAN CATCH  Final   Special Requests NONE  Final   Colony Count   Final    25,000 COLONIES/ML Performed at [facility]    Culture   Final    Multiple bacterial morphotypes present, none predominant. Suggest appropriate recollection if clinically indicated. Performed at [facility]    Report Status 12/26/2014 FINAL  Final  Blood culture (routine x 2)     Status: None (Preliminary result)   Collection Time: 12/25/14  6:00 PM  Result Value Ref Range Status   Specimen Description BLOOD ARM LEFT  Final   Special Requests BOTTLES DRAWN AEROBIC AND ANAEROBIC 10CC  Final   Culture   Final           BLOOD CULTURE  RECEIVED NO GROWTH TO DATE CULTURE WILL BE HELD FOR 5 DAYS BEFORE ISSUING A FINAL NEGATIVE REPORT Performed at [facility]    Report Status PENDING  Incomplete  Blood culture (routine x 2)     Status: None (Preliminary result)   Collection Time: 12/25/14  6:15 PM  Result Value  Ref Range Status   Specimen Description BLOOD ARM RIGHT  Final   Special Requests BOTTLES DRAWN AEROBIC AND ANAEROBIC 10CC  Final   Culture   Final           BLOOD CULTURE RECEIVED NO GROWTH TO DATE CULTURE WILL BE HELD FOR 5 DAYS BEFORE ISSUING A FINAL NEGATIVE REPORT Performed at [facility]    Report Status PENDING  Incomplete  MRSA PCR Screening     Status: None   Collection Time: 12/25/14 10:30 PM  Result Value Ref Range Status   MRSA by PCR NEGATIVE NEGATIVE Final    Comment:        The GeneXpert MRSA Assay (FDA approved for NASAL specimens only), is one component of a comprehensive MRSA colonization surveillance program. It is not intended to diagnose MRSA infection nor to guide or monitor treatment for MRSA infections.     Coagulation Studies: No results for input(s): LABPROT, INR in the last 72 hours.  Imaging: Dg Shoulder Right  12/31/2014   CLINICAL DATA:  Acute right shoulder pain after fall. Initial encounter.  EXAM: RIGHT SHOULDER - 2+ VIEW  COMPARISON:  None.  FINDINGS: There is no evidence of fracture or dislocation. There is no evidence of arthropathy or other focal bone abnormality. Soft tissues are unremarkable.  IMPRESSION: No significant abnormality seen in the right shoulder.   Electronically Signed   By: Sabino Dick M.D.   On: 12/31/2014 10:25   Mr Brain Wo Contrast  12/30/2014   CLINICAL DATA:  Acute encephalopathy. Metastatic disease. Confusion.  EXAM: MRI HEAD WITHOUT CONTRAST  TECHNIQUE: Multiplanar, multiecho pulse sequences of the brain and surrounding structures were obtained without intravenous contrast.  COMPARISON:  CT head 12/25/2014  FINDINGS:  Ventricles are diffusely dilated and unchanged from the recent CT. There is moderate ventricular dilatation involving the third, fourth and lateral ventricles. There is underlying cerebral atrophy. 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  Negative for acute infarct.  Chronic microvascular ischemic changes in the white matter. 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 Mild chronic microvascular ischemia in the pons. Chronic infarct left parietal lobe unchanged from the CT of 03/14/2013.  Negative for hemorrhage or fluid collection  Negative for mass or edema. Negative for metastatic disease on unenhanced images. Image quality degraded by motion.  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 Evaluate clinically for normal pressure hydrocephalus symptoms.  Chronic microvascular ischemic change. No acute infarct or mass on unenhanced images.   Electronically Signed   By: Franchot Gallo M.D.   On: 12/30/2014 12:18   Dg Hip Unilat With Pelvis 2-3 Views Right  12/31/2014   CLINICAL DATA:  Right hip pain, acute.  Initial encounter.  EXAM: DG HIP W/ PELVIS 2-3V*R*  COMPARISON:  None.  FINDINGS: Negative for fracture or dislocation about the right hip. There are moderate arthritic changes, with joint space narrowing and subchondral sclerosis as well as small osteophyte formation. There are calcifications around the greater trochanter consistent with prior trochanteric bursitis. No bone lesion or bony destruction is evident.  IMPRESSION: Moderate right hip arthritis. Probable trochanteric bursitis. Negative for fracture, dislocation or bony destruction.   Electronically Signed   By: Andreas Newport M.D.   On: 12/31/2014 10:28       Assessment and plan per attending neurologist  Etta Quill PA-C [facility] [phone]  12/31/2014, 10:59 AM   Assessment/Plan:  64 yo F with newly  diagnosed bladder cancer and worsening memory difficulty. Her MRI even as far back as 2012 shows extensive atrophy and ventricular dilitation, I do not think that NPH is likely given the previous imaging and clincial course. Also given her new diagnosis, I am not sure how aggressive a workup for her memory difficulty needs to be, especially in teh settin gof longstanding severe atrophy. A UTI certainly could cause an abrupt worsening of an underlying cognitive dysfunction.   1) B12, TSH 2) could consider outpatient neuropsych testing.  3) could consider paraneoplastic evaluation, but given that therapy for this would be treatment of her cancer, I do not think that it would be likely to change management at this time.  4) If she does pursue aggressive care, could follow up with neurology as an outpatient.   Roland Rack, MD [facility] [phone]  If 7pm- 7am, please page neurology on call as listed in [place].

## 2014-12-31 NOTE — Discharge Instructions (Signed)
Follow with Primary MD in 7 days   Get CBC, CMP, 2 view Chest X ray checked  by Primary MD next visit.    Activity: As tolerated with Full fall precautions use walker/cane & assistance as needed   Disposition SNF   Diet: Heart Healthy  with feeding assistance and aspiration precautions as needed.  For Heart failure patients - Check your Weight same time everyday, if you gain over 2 pounds, or you develop in leg swelling, experience more shortness of breath or chest pain, call your Primary MD immediately. Follow Cardiac Low Salt Diet and 1.8 lit/day fluid restriction.   On your next visit with your primary care physician please Get Medicines reviewed and adjusted.   Please request your Prim.MD to go over all Hospital Tests and Procedure/Radiological results at the follow up, please get all Hospital records sent to your Prim MD by signing hospital release before you go home.   If you experience worsening of your admission symptoms, develop shortness of breath, life threatening emergency, suicidal or homicidal thoughts you must seek medical attention immediately by calling 911 or calling your MD immediately  if symptoms less severe.  You Must read complete instructions/literature along with all the possible adverse reactions/side effects for all the Medicines you take and that have been prescribed to you. Take any new Medicines after you have completely understood and accpet all the possible adverse reactions/side effects.   Do not drive, operating heavy machinery, perform activities at heights, swimming or participation in water activities or provide baby sitting services if your were admitted for syncope or siezures until you have seen by Primary MD or a Neurologist and advised to do so again.  Do not drive when taking Pain medications.    Do not take more than prescribed Pain, Sleep and Anxiety Medications  Special Instructions: If you have smoked or chewed Tobacco  in the last 2 yrs  please stop smoking, stop any regular Alcohol  and or any Recreational drug use.  Wear Seat belts while driving.   Please note  You were cared for by a hospitalist during your hospital stay. If you have any questions about your discharge medications or the care you received while you were in the hospital after you are discharged, you can call the unit and asked to speak with the hospitalist on call if the hospitalist that took care of you is not available. Once you are discharged, your primary care physician will handle any further medical issues. Please note that NO REFILLS for any discharge medications will be authorized once you are discharged, as it is imperative that you return to your primary care physician (or establish a relationship with a primary care physician if you do not have one) for your aftercare needs so that they can reassess your need for medications and monitor your lab values.

## 2014-12-31 NOTE — Discharge Summary (Signed)
Taylor Foley, is a 64 y.o. female  DOB [birth year]  MRN [id number].  Admission date:  12/25/2014  Admitting Physician  Etta Quill, DO  Discharge Date:  12/31/2014   Primary MD  PROVIDER NOT IN SYSTEM  Recommendations for primary care physician for things to follow:   Requires close follow-up with urology, neurology, oncology and orthopedics.   Admission Diagnosis  Confusion [R41.0] Chest pain [R07.9] Cellulitis of lower extremity, unspecified laterality [L03.119] Chest pain, unspecified chest pain type [R07.9]   Discharge Diagnosis  Confusion [R41.0] Chest pain [R07.9] Cellulitis of lower extremity, unspecified laterality [L03.119] Chest pain, unspecified chest pain type [R07.9]    Principal Problem:   Chest pain Active Problems:   Liver lesion   Multiple lung nodules   Adjustment disorder with mixed anxiety and depressed mood   Acute encephalopathy   Palliative care encounter   Cancer related pain   Confusion      Past Medical History  Diagnosis Date  . Hypertension   . Hypercholesteremia   . Syncope and collapse 11/2011; 03/14/2013  . Shortness of breath     "sometimes lying down" (03/14/2013)  . Blood in the urine     "here lately; q time I go to pee" (03/14/2013)  . Arthritis     "all over" (03/24/2013)    Past Surgical History  Procedure Laterality Date  . Orif wrist fracture  12/04/2011    Procedure: OPEN REDUCTION INTERNAL FIXATION (ORIF) WRIST FRACTURE;  Surgeon: Marybelle Killings;  Location: [facility];  Service: Orthopedics;  Laterality: Left;       History of present illness and  Hospital Course:     Kindly see H&P for history of present illness and admission details, please review complete Labs, Consult reports and Test reports for all details in brief  HPI  from the history and physical done  on the day of admission   Taylor Foley is a 64 y.o. female who presents to the ED with R sided chest and shoulder pain. Worse with shoulder movement. Better with rest. Patient also has baseline SOB, taking no meds for last several months. Noticed blood in urine, unable to state for how long. Legs are red, swollen and painful, also unable to tell us for how long. Patient also suspects that "something may be wrong with my head".   Hospital Course   1. Right-sided shoulder and hip pain. Likely due to advanced DJD. X-rays nonacute without any fracture. EKG stable. Placed on Motrin along with Medrol Dosepak for possible right trochanteric bursitis, one-time outpatient or to follow-up. Patient refusing all IV medications and further workup.   2. Stage IV bladder cancer likely new diagnosis. Based on CT scan. Follow with urologist Dr. Alinda Money. Family is healthcare proxy. Outpatient urology and oncology follow-up.    3. UTI. 5 more days of oral Cipro. Refused IV.   4. Refusal of medical treatment. Seen by psych, deemed competent for social decisions but not for medical decisions. Family is healthcare proxy. She does have evidence of  severe cerebral atrophy on MRI. Neurology was requested to evaluate. For now supportive care only. She does not have history of alcohol abuse. Patient neurology workup should be continued.  5. Poor social and living situation. Placement.     Discharge Condition: Stable   Follow UP  Follow-up Information    Follow up with DUDA,MARCUS V, MD. Schedule an appointment as soon as possible for a visit in 3 days.   Specialty:  Orthopedic Surgery   Why:  Sybil right trochanteric bursitis   Contact information:   [place] [address] [phone]       Follow up with [facility]    . Schedule an appointment as soon as possible for a visit in 1 week.   Contact information:   [address] [place]  [place] [address] [phone]      Follow up with Dutch Gray, MD. Schedule an appointment as soon as possible for a visit in 1 week.   Specialty:  Urology   Contact information:   [place] [address] [phone]       Follow up with [facility], MD. Schedule an appointment as soon as possible for a visit in 1 week.   Specialty:  Oncology   Contact information:   [place] [address] [phone]       Follow up with [facility]. Schedule an appointment as soon as possible for a visit in 1 week.   Contact information:   [address] [place] [address] [phone]        Discharge Instructions  and  Discharge Medications          Discharge Instructions    Diet - low sodium heart healthy    Complete by:  As directed      Discharge instructions    Complete by:  As directed   Follow with Primary MD in 7 days   Get CBC, CMP, 2 view Chest X ray checked  by Primary MD next visit.    Activity: As tolerated with Full fall precautions use walker/cane & assistance as needed   Disposition SNF   Diet: Heart Healthy  with feeding assistance and aspiration precautions as needed.  For Heart failure patients - Check your Weight same time everyday, if you gain over 2 pounds, or you develop in leg swelling, experience more shortness of breath or chest pain, call your Primary MD immediately. Follow Cardiac Low Salt Diet and 1.8 lit/day fluid restriction.   On your next visit with your primary care physician please Get Medicines reviewed and adjusted.   Please request your Prim.MD to go over all Hospital Tests and Procedure/Radiological results at the follow up, please get all Hospital records sent to your Prim MD by signing hospital release before you go home.   If you experience worsening of your admission symptoms, develop shortness of breath, life threatening emergency, suicidal or homicidal  thoughts you must seek medical attention immediately by calling 911 or calling your MD immediately  if symptoms less severe.  You Must read complete instructions/literature along with all the possible adverse reactions/side effects for all the Medicines you take and that have been prescribed to you. Take any new Medicines after you have completely understood and accpet all the possible adverse reactions/side effects.   Do not drive, operating heavy machinery, perform activities at heights, swimming or participation in water activities or provide baby sitting  services if your were admitted for syncope or siezures until you have seen by Primary MD or a Neurologist and advised to do so again.  Do not drive when taking Pain medications.    Do not take more than prescribed Pain, Sleep and Anxiety Medications  Special Instructions: If you have smoked or chewed Tobacco  in the last 2 yrs please stop smoking, stop any regular Alcohol  and or any Recreational drug use.  Wear Seat belts while driving.   Please note  You were cared for by a hospitalist during your hospital stay. If you have any questions about your discharge medications or the care you received while you were in the hospital after you are discharged, you can call the unit and asked to speak with the hospitalist on call if the hospitalist that took care of you is not available. Once you are discharged, your primary care physician will handle any further medical issues. Please note that NO REFILLS for any discharge medications will be authorized once you are discharged, as it is imperative that you return to your primary care physician (or establish a relationship with a primary care physician if you do not have one) for your aftercare needs so that they can reassess your need for medications and monitor your lab values.     Increase activity slowly    Complete by:  As directed             Medication List    TAKE these medications         ciprofloxacin 500 MG tablet  Commonly known as:  CIPRO  Take 1 tablet (500 mg total) by mouth 2 (two) times daily. 5 more days     ibuprofen 600 MG tablet  Commonly known as:  ADVIL,MOTRIN  Take 1 tablet (600 mg total) by mouth every 8 (eight) hours as needed.     methylPREDNISolone 4 MG tablet  Commonly known as:  MEDROL DOSEPAK  follow package directions          Diet and Activity recommendation: See Discharge Instructions above   Consults obtained - Neuro, urology, Psych   Major procedures and Radiology Reports - PLEASE review detailed and final reports for all details, in brief -       Dg Chest 2 View  12/25/2014   CLINICAL DATA:  Chest pain for 1 day, history hypertension  EXAM: CHEST  2 VIEW  COMPARISON:  03/14/2013  FINDINGS: Normal heart size, mediastinal contours, and pulmonary vascularity.  Multiple small nodular density of identified concerning for multiple pulmonary nodules.  Scattered interstitial prominence and suspect underlying emphysematous changes.  No segmental consolidation, pleural effusion or pneumothorax.  Bones appear demineralized.  IMPRESSION: Emphysematous and chronic interstitial changes with new subtle nodular densities in both lungs suspicious for pulmonary nodules ; further evaluation by CT chest with contrast recommended for further evaluation.   Electronically Signed   By: Lavonia Dana M.D.   On: 12/25/2014 17:00   Dg Shoulder Right  12/31/2014   CLINICAL DATA:  Acute right shoulder pain after fall. Initial encounter.  EXAM: RIGHT SHOULDER - 2+ VIEW  COMPARISON:  None.  FINDINGS: There is no evidence of fracture or dislocation. There is no evidence of arthropathy or other focal bone abnormality. Soft tissues are unremarkable.  IMPRESSION: No significant abnormality seen in the right shoulder.   Electronically Signed   By: Sabino Dick M.D.   On: 12/31/2014 10:25   Ct Head Wo Contrast  12/25/2014  CLINICAL DATA:  Confusion  EXAM: CT HEAD WITHOUT  CONTRAST  TECHNIQUE: Contiguous axial images were obtained from the base of the skull through the vertex without intravenous contrast.  COMPARISON:  03/14/2013  FINDINGS: Imaging at the vertex is motion degraded. This could miss a subtle or small finding, but no acute pathology is suspected at this level.  Skull and Sinuses:Negative for fracture or destructive process. The mastoids, middle ears, and imaged paranasal sinuses are clear.  Orbits: No acute abnormality.  Brain: There is no evidence of acute infarct, hemorrhage, obstructive hydrocephalus, or mass lesion. Mildly increased lateral and third ventriculomegaly, out of proportion to brain atrophy. This pattern has been seen previously and is suggestive of a communicating hydrocephalus. There is chronic small-vessel disease with patchy ischemic gliosis in the bilateral cerebral white matter. There is been a small remote cortical infarct in the high left parietal lobe. Stable retro cerebellar CSF accumulation without mass effect on the fourth ventricle.  IMPRESSION: 1. No acute findings on this degraded study. 2. Mild progression of ventriculomegaly since 2014. 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 3. 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   Electronically Signed   By: Jorje Guild M.D.   On: 12/25/2014 21:34   Ct Angio Chest Pe W/cm &/or Wo Cm  12/25/2014   CLINICAL DATA:  64 year old female with 1 day history of right-sided chest pain.  EXAM: CT ANGIOGRAPHY CHEST WITH CONTRAST  TECHNIQUE: Multidetector CT imaging of the chest was performed using the standard protocol during bolus administration of intravenous contrast. Multiplanar CT image reconstructions and MIPs were obtained to evaluate the vascular anatomy.  CONTRAST:  110mL OMNIPAQUE IOHEXOL 350 MG/ML SOLN  COMPARISON:  No priors.  FINDINGS: Mediastinum/Lymph Nodes: There are no filling defects within the pulmonary arterial tree to suggest underlying  pulmonary embolism. Heart size is borderline enlarged. Trace amount of pericardial fluid and/or thickening, unlikely to be of hemodynamic significance at this time. No associated pericardial calcification. No pathologically enlarged mediastinal or hilar lymph nodes. Esophagus is unremarkable in appearance. No axillary lymphadenopathy.  Lungs/Pleura: There are innumerable pulmonary nodules scattered throughout the lungs bilaterally, many of which are cavitary. These appear randomly distributed, although there is a slight craniocaudal gradient. The largest nodule is in the left upper lobe measuring up to 1.5 x 1.0 cm (image 34 of series 6). No confluent consolidative airspace disease. No pleural effusions.  Musculoskeletal/Soft Tissues: There are no aggressive appearing lytic or blastic lesions noted in the visualized portions of the skeleton.  Upper Abdomen: There are innumerable ill-defined intermediate attenuation lesions scattered throughout the hepatic parenchyma, highly concerning for widespread metastatic disease. The largest of these lesions is in segment 4A (image 63 of series 4) measuring 2.1 x 2.4 cm. Large nodule in the right adrenal gland measuring 2.5 x 1.6 cm.  Review of the MIP images confirms the above findings.  IMPRESSION: 1. No evidence of pulmonary embolism. 2. Findings in the lungs and liver highly concerning for widespread metastatic disease. Given the cavitary nature of many of the pulmonary nodules, a primary squamous cell carcinoma is suspected. It is possible that the cavitary pulmonary nodules could reflect widespread septic emboli, however, that is not favored, and would not account for the hepatic findings. 3. Right adrenal nodule also concerning for metastatic disease. These results were called by telephone at the time of interpretation on 12/25/2014 at 7:14 pm to Dr. Kirstie Peri, who verbally acknowledged these results.   Electronically Signed   By: Quillian Quince  Entrikin M.D.   On:  12/25/2014 19:17   Mr Brain Wo Contrast  12/30/2014   CLINICAL DATA:  Acute encephalopathy. Metastatic disease. Confusion.  EXAM: MRI HEAD WITHOUT CONTRAST  TECHNIQUE: Multiplanar, multiecho pulse sequences of the brain and surrounding structures were obtained without intravenous contrast.  COMPARISON:  CT head 12/25/2014  FINDINGS: Ventricles are diffusely dilated and unchanged from the recent CT. There is moderate ventricular dilatation involving the third, fourth and lateral ventricles. There is underlying cerebral atrophy. 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  Negative for acute infarct.  Chronic microvascular ischemic changes in the white matter. 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 Mild chronic microvascular ischemia in the pons. Chronic infarct left parietal lobe unchanged from the CT of 03/14/2013.  Negative for hemorrhage or fluid collection  Negative for mass or edema. Negative for metastatic disease on unenhanced images. Image quality degraded by motion.  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 Evaluate clinically for normal pressure hydrocephalus symptoms.  Chronic microvascular ischemic change. No acute infarct or mass on unenhanced images.   Electronically Signed   By: Franchot Gallo M.D.   On: 12/30/2014 12:18   Ct Abdomen Pelvis W Contrast  12/26/2014   CLINICAL DATA:  Metastatic adenocarcinoma  EXAM: CT ABDOMEN AND PELVIS WITH CONTRAST  TECHNIQUE: Multidetector CT imaging of the abdomen and pelvis was performed using the standard protocol following bolus administration of intravenous contrast.  CONTRAST:  174mL OMNIPAQUE IOHEXOL 300 MG/ML  SOLN  COMPARISON:  CT scan of the chest 12/25/2014  FINDINGS: Again noted bilateral lung bases small nodules consistent with metastatic disease.  Sagittal images of the spine shows degenerative changes thoracolumbar spine. No  destructive bony lesions are noted. No calcified gallstones are noted within gallbladder.  Again noted innumerable liver lesions consistent with metastatic disease. The largest lesion in right hepatic dome posteriorly measures 4.3 cm. Largest lesion and right hepatic dome anteriorly measures 2.8 cm. Largest lesion in left hepatic lobe anteriorly measures 1.9 cm.  A again noted nodular mass in right adrenal gland measures 2.7 cm highly suspicious for metastatic disease. There are scattered low-density lesions within right kidney the largest measures about 9 mm. Metastatic disease cannot be excluded. There is a probable cyst within left kidney posteriorly measures 1.2 cm. At least 2 ill-defined lesions are noted within lower pole of the left kidney the largest measures 7 mm suspicious for metastatic disease.  There is no hydronephrosis or hydroureter. Delayed renal images shows bilateral renal symmetrical excretion.  Bilateral visualized proximal ureter is unremarkable.  No small bowel obstruction.  No aortic aneurysm.  The terminal ileum is unremarkable. No pericecal inflammation. There is significant irregular thickening of urinary bladder wall. Posterior scattered right posterior bladder wall measures at least 3 cm in thickness. Findings are highly suspicious for urinary bladder mass.  The left adrenal gland is unremarkable. The pancreas and spleen is unremarkable. No breast masses are identified in visualized breast tissue. There is no inguinal adenopathy.  The uterus is atrophic. No adnexal masses noted. Probable splenic cyst measures 9 mm.  IMPRESSION: 1. Again noted multiple pulmonary nodules lung bases consistent with metastatic disease. 2. Multiple nodular masses within liver consistent with metastatic disease. 3. Again noted right adrenal mass measures at least 2.7 cm highly suspicious for metastatic disease. 4. Bilateral small indeterminate low-density lesions bilateral kidneys. Although some of the lesions  measured cysts metastatic disease cannot be excluded. Bilateral renal symmetrical  excretion. No hydronephrosis or hydroureter. 5. No small bowel obstruction.  No ascites or free air. 6. There is irregular thickening of urinary bladder wall with mass like appearance in right posterior aspect of the urinary bladder. A urinary bladder mass is highly suspected. Correlation with urology exam and cystoscopy is recommended. 7. Atrophic uterus.  No inguinal adenopathy.  No adnexal mass.   Electronically Signed   By: Lahoma Crocker M.D.   On: 12/26/2014 18:21   Dg Hip Unilat With Pelvis 2-3 Views Right  12/31/2014   CLINICAL DATA:  Right hip pain, acute.  Initial encounter.  EXAM: DG HIP W/ PELVIS 2-3V*R*  COMPARISON:  None.  FINDINGS: Negative for fracture or dislocation about the right hip. There are moderate arthritic changes, with joint space narrowing and subchondral sclerosis as well as small osteophyte formation. There are calcifications around the greater trochanter consistent with prior trochanteric bursitis. No bone lesion or bony destruction is evident.  IMPRESSION: Moderate right hip arthritis. Probable trochanteric bursitis. Negative for fracture, dislocation or bony destruction.   Electronically Signed   By: Andreas Newport M.D.   On: 12/31/2014 10:28    Micro Results      Recent Results (from the past 240 hour(s))  Urine culture     Status: None   Collection Time: 12/25/14  5:56 PM  Result Value Ref Range Status   Specimen Description URINE, CLEAN CATCH  Final   Special Requests NONE  Final   Colony Count   Final    25,000 COLONIES/ML Performed at [facility]    Culture   Final    Multiple bacterial morphotypes present, none predominant. Suggest appropriate recollection if clinically indicated. Performed at [facility]    Report Status 12/26/2014 FINAL  Final  Blood culture (routine x 2)     Status: None (Preliminary result)   Collection Time: 12/25/14  6:00 PM    Result Value Ref Range Status   Specimen Description BLOOD ARM LEFT  Final   Special Requests BOTTLES DRAWN AEROBIC AND ANAEROBIC 10CC  Final   Culture   Final           BLOOD CULTURE RECEIVED NO GROWTH TO DATE CULTURE WILL BE HELD FOR 5 DAYS BEFORE ISSUING A FINAL NEGATIVE REPORT Performed at [facility]    Report Status PENDING  Incomplete  Blood culture (routine x 2)     Status: None (Preliminary result)   Collection Time: 12/25/14  6:15 PM  Result Value Ref Range Status   Specimen Description BLOOD ARM RIGHT  Final   Special Requests BOTTLES DRAWN AEROBIC AND ANAEROBIC 10CC  Final   Culture   Final           BLOOD CULTURE RECEIVED NO GROWTH TO DATE CULTURE WILL BE HELD FOR 5 DAYS BEFORE ISSUING A FINAL NEGATIVE REPORT Performed at [facility]    Report Status PENDING  Incomplete  MRSA PCR Screening     Status: None   Collection Time: 12/25/14 10:30 PM  Result Value Ref Range Status   MRSA by PCR NEGATIVE NEGATIVE Final    Comment:        The GeneXpert MRSA Assay (FDA approved for NASAL specimens only), is one component of a comprehensive MRSA colonization surveillance program. It is not intended to diagnose MRSA infection nor to guide or monitor treatment for MRSA infections.        Today   Subjective:   Taylor Foley today has no headache,no  chest abdominal pain,no new weakness tingling or numbness, feels much better.  Objective:   Blood pressure 147/79, pulse 87, temperature 98.7 F (37.1 C), temperature source Oral, resp. rate 20, height 5\' 6"  (1.676 m), weight 73.573 kg (162 lb 3.2 oz), SpO2 99 %.  No intake or output data in the 24 hours ending 12/31/14 1141  Exam Awake Alert, Oriented x 3, No new F.N deficits, Normal affect [place].AT,PERRAL Supple Neck,No JVD, No cervical lymphadenopathy appriciated.  Symmetrical Chest wall movement, Good air movement bilaterally, CTAB RRR,No Gallops,Rubs or new Murmurs, No Parasternal Heave +ve  B.Sounds, Abd Soft, Non tender, No organomegaly appriciated, No rebound -guarding or rigidity. No Cyanosis, Clubbing or edema, No new Rash or bruise  Data Review   CBC w Diff:  Lab Results  Component Value Date   WBC 6.7 12/27/2014   HGB 10.0* 12/27/2014   HCT 30.2* 12/27/2014   PLT 252 12/27/2014   LYMPHOPCT 21 12/25/2014   MONOPCT 8 12/25/2014   EOSPCT 3 12/25/2014   BASOPCT 0 12/25/2014    CMP:  Lab Results  Component Value Date   NA 137 12/27/2014   K 4.1 12/27/2014   CL 107 12/27/2014   CO2 22 12/27/2014   BUN 12 12/27/2014   CREATININE 0.95 12/27/2014   PROT 5.2* 12/27/2014   ALBUMIN 2.4* 12/27/2014   BILITOT 0.3 12/27/2014   ALKPHOS 60 12/27/2014   AST 33 12/27/2014   ALT 22 12/27/2014  .   Total Time in preparing paper work, data evaluation and todays exam - 35 minutes  Thurnell Lose M.D on 12/31/2014 at 11:41 AM  [facility] Office  [phone]

## 2014-12-31 NOTE — Progress Notes (Signed)
   12/31/14 1000  Clinical Encounter Type  Visited With Patient;Health care provider  Visit Type Initial   Chaplain was referred to patient via spiritual care consult. Consult indicated patient would like to create or update an advanced directive. Chaplain's office was also called regarding patient wanting an advanced directive yesterday afternoon. Chaplain spoke to patient about completing an advanced directive and she seemed confused. Patient could not recall anyone talking to her about an advanced directive or or asking her about one. When chaplain's office was called patient's son was mentioned. Patient affirmed that the conversation about an advanced directive may have happened with her son but she was not present. Chaplain did not wish to provide further confusion and informed patient that if she wished to look into an advanced directive further she can always contact the spiritual care department or a nurse. Tomasz Steeves, Claudius Sis, Chaplain  10:30 AM

## 2014-12-31 NOTE — Progress Notes (Signed)
CSW (Clinical Social Worker) prepared pt dc packet and placed with shadow chart. CSW arranged non-emergent ambulance transport. Pt, pt family, pt nurse, and facility informed. CSW signing off.  Jamecia Lerman, LCSWA [phone]  

## 2015-01-01 ENCOUNTER — Non-Acute Institutional Stay (SKILLED_NURSING_FACILITY): Payer: Medicaid Other | Admitting: Internal Medicine

## 2015-01-01 ENCOUNTER — Encounter (HOSPITAL_COMMUNITY): Payer: Self-pay | Admitting: *Deleted

## 2015-01-01 DIAGNOSIS — R918 Other nonspecific abnormal finding of lung field: Secondary | ICD-10-CM | POA: Diagnosis not present

## 2015-01-01 DIAGNOSIS — I10 Essential (primary) hypertension: Secondary | ICD-10-CM

## 2015-01-01 DIAGNOSIS — R079 Chest pain, unspecified: Secondary | ICD-10-CM

## 2015-01-01 DIAGNOSIS — F4323 Adjustment disorder with mixed anxiety and depressed mood: Secondary | ICD-10-CM

## 2015-01-01 DIAGNOSIS — G9340 Encephalopathy, unspecified: Secondary | ICD-10-CM

## 2015-01-01 LAB — CULTURE, BLOOD (ROUTINE X 2)
Culture: NO GROWTH
Culture: NO GROWTH

## 2015-01-01 NOTE — Progress Notes (Addendum)
                 Your procedure is scheduled on: Monday, January 05, 2015  Report to [place] [facility] [place] with Derrick Ravel Lpn at [facility] [phone] fax number [phone] and explained the pre op instructions.  In the discussion I told Stanton Kidney that the instructions would be faxed to the facility today with a follow up telephone call to confirm that the instructions were received and clear. Come into the building thru the Emergency Room entrance and follow the hallway to your left.  You will see signs for the [place].    Call this number if you have problems the morning of surgery: [phone]    Remember: Bring insurance card and picture ID, PLEASE SEND CURRENT MEDICATION LIST ANDTIME LAST MEDICATIONS TAKEN  WITH PATIENT MORNING OF SURGERY.   Do not drink liquids or  eat food:After Midnight. Sunday night, January 04, 2015     Take these medicines the morning of surgery with A SIP OF WATER: Medrol dose pack                                  [place]     Do not wear jewelry, make-up or nail polish.  Do not wear lotions, powders, or perfumes. You may wear deodorant.             Men may shave face and neck.  Do not bring valuables to the hospital.  Contacts, dentures or bridgework may not be worn into surgery.  Leave suitcase in the car. After surgery it may be brought to your room.  For patients admitted to the hospital, checkout time is 11:00 AM the day of discharge.   Patients discharged the day of surgery will not be allowed to drive home.   Name and phone number of your driver: [facility] will provide the transportation [phone]      The patient's son Elta [place] [phone] will not be able to come to the hospital during the surgery he will be at work.                     Call Guadelupe Sabin, RN pre op nurse if needed [phone]  Main Number [phone]  [phone]              FAILURE TO FOLLOW THESE INSTRUCTIONS MAY RESULT IN THE CANCELLATION OF YOUR SURGERY.     PATIENT   SIGNATURE___________________________________________________

## 2015-01-01 NOTE — Progress Notes (Signed)
Patient ID: Taylor Foley, female   DOB: [birth year], 64 y.o.   MRN: [id number]    this is an acute visit.  Level care skilled.  [facility].   chief complaint-acute visit status post hospitalization for chest pain.   History of present illness.   patient is a 64 year old female who came to the emergency department with chest and shoulder pain worse with shoulder movement better with rest.   Staff also noticed blood in her urine and red swollen legs.   In regards to the pain this was thought to be more right shoulder and hip pain likely due to degenerative joint disease x-rays were nonacute EKG was stable -she was placed on Motrin along with a Medrol Dosepak for possible right  Trochanteric bursitis - patient apparently refused all IV medications and further workup.  She does have a history of stage IV bladder cancer likely a new diagnosis based on a CT scan she will need follow-up with urology.   She is on Cipro for a UTI she refused IV antibiotics.  Patient apparently consistently refused medical treatment during her hospitalization she was seen by psychiatric services and deemed competent for social decisions but not for medical decisions.   She has evidence apparently of severe cerebral atrophy on MRI - for now suggestive for supportive care she was seen by neurology.   She is here essentially secondary to poor social and living situation.   his medical history.  Chest pain unspecified apparently resolved.   some history of cellulitis of lower extremities apparently this resolved during hospitalization she has been discharged on Cipro although apparently there is possibly a UTI etiology here.  History of liver lesion.  Multiple lung nodules.  Adjustment disorder with mixed anxiety and depressed mood.  Acute encephalopathy.   Confusion.   hypertension.  Hyperlipidemia.  Chronic shortness of breath.   osteoarthritis.   surgical history-history of ORIF  of the wrist back in 2012.  Medications.   These actually are fairly minimal.  Cipro 500 mg twice a day for 4 additional days.   Motrin 60 mg every 8 hours when necessary.   Medrol dose pack per instructions.   Review of systems- this is largely unattainable secondary to patient not wanting to be examined -she is not however complaining of any pain or shortness of breath. My observation and what nursing staff tells me as well she is ambulating about the facility although she is somewhat at times hard to direct.  Physical exam.  Temperature is 97.1 pulse 96 respirations 20 blood pressure 150/84.  In general this is a pleasant elderly female however she is refusing essentially her physical exam this evening - says she does not want to be examined.  Her skin from what I can tell is warm and dry she does not appear to be diaphoretic.   again I could not evaluate her oropharynx chest her cardiac situation nor her abdomen.  Musculoskeletal wise I did not note any overt deformities although exam was quite limited here she is ambulatory ambulating about the facility.  Neurologic her speech is clear I do not see any lateralizing findings from what I can assess- first. Psych-- she appears confused agitated will not allow physical exam otherwise is not unpleasant.   Labs.   12/31/2014. B12 level DCLXXIVuric acid 3.5-TSH 1.986.  12/27/2014.  Sodium 137 potassium 4.1 BUN 12 creatinine 0.95.  Liver function tests within normal limits except albumin of 2.4.  WBC 6.7 hemoglobin 10.0 platelets 252.  Jan. 21st 2016-blood cultures were negative-urine showed multiple pathogens colony count 25,000 predominant      Assessment and plan.   #1-history of chest pain as well as right shoulder pain-this appears to be better although again exam was limited-she is on Motrin as well as a steroid Dosepak -she will need orthopedic follow-up. #2 history of stage IV bladder cancer this will need  urology follow-up apparently this has been scheduled with Dr. Alinda Money.   #3- UTI-per chart review it appears this was not real significant-she is on Cipro is completing a course -one would wonder of possibly this is also for some suspected cellulitis although I could not really assess her legs significantly this evening because of her refusal. #4 history hypertension-not on currently medication blood pressure most recently 150/84 this will need to be monitored. #5 history of liver lesions -as well as bladder cancer will need follow-up by oncology.   #6-history of adjustment disorder with mixed anxiety and depressed mood- she will need an in-house psychiatric consult -. She has a history of refusing medical treatment   Also would like to update CBC and BMP tomorrow for updated values.   Clinically she does not appear to be unstable -is ambulatory appears to not be in any distress but is somewhat agitated and confused when she is redirected   [id number]-of note greater than 35 minutes spent  Trying to  assess patient-reviewing her hospital records- and discussing her status with nursing staff-  And coordinating plan of care- of note greater than 50% of time spent coordinating plan of care     6.

## 2015-01-04 ENCOUNTER — Encounter (HOSPITAL_COMMUNITY): Payer: Self-pay | Admitting: Anesthesiology

## 2015-01-04 NOTE — Anesthesia Preprocedure Evaluation (Deleted)
Anesthesia Evaluation  Patient identified by MRN, date of birth, ID band Patient awake    Reviewed: Allergy & Precautions, NPO status , Patient's Chart, lab work & pertinent test results  Airway        Dental   Pulmonary shortness of breath, neg pneumonia -,  Multiple lung nodules by X ray         Cardiovascular hypertension,     Neuro/Psych Hx of confusion    GI/Hepatic negative GI ROS, Possible liver nodules   Endo/Other    Renal/GU Bladder ca c mets     Musculoskeletal  (+) Arthritis -,   Abdominal   Peds  Hematology   Anesthesia Other Findings   Reproductive/Obstetrics                             Anesthesia Physical Anesthesia Plan  ASA: III  Anesthesia Plan: General   Post-op Pain Management:    Induction: Intravenous  Airway Management Planned: Oral ETT  Additional Equipment:   Intra-op Plan:   Post-operative Plan: Extubation in OR  Informed Consent: I have reviewed the patients History and Physical, chart, labs and discussed the procedure including the risks, benefits and alternatives for the proposed anesthesia with the patient or authorized representative who has indicated his/her understanding and acceptance.   Dental advisory given  Plan Discussed with: CRNA and Surgeon  Anesthesia Plan Comments:         Anesthesia Quick Evaluation

## 2015-01-05 ENCOUNTER — Encounter (HOSPITAL_COMMUNITY): Admission: RE | Payer: Self-pay | Source: Ambulatory Visit

## 2015-01-05 ENCOUNTER — Non-Acute Institutional Stay (SKILLED_NURSING_FACILITY): Payer: Medicaid Other | Admitting: Internal Medicine

## 2015-01-05 ENCOUNTER — Ambulatory Visit (HOSPITAL_COMMUNITY): Admission: RE | Admit: 2015-01-05 | Payer: Self-pay | Source: Ambulatory Visit | Admitting: Urology

## 2015-01-05 DIAGNOSIS — C679 Malignant neoplasm of bladder, unspecified: Secondary | ICD-10-CM

## 2015-01-05 DIAGNOSIS — I10 Essential (primary) hypertension: Secondary | ICD-10-CM | POA: Diagnosis not present

## 2015-01-05 DIAGNOSIS — Z5329 Procedure and treatment not carried out because of patient's decision for other reasons: Secondary | ICD-10-CM

## 2015-01-05 DIAGNOSIS — M25511 Pain in right shoulder: Secondary | ICD-10-CM | POA: Diagnosis not present

## 2015-01-05 DIAGNOSIS — N3000 Acute cystitis without hematuria: Secondary | ICD-10-CM

## 2015-01-05 HISTORY — DX: Neoplasm of unspecified behavior of bladder: D49.4

## 2015-01-05 SURGERY — TURBT (TRANSURETHRAL RESECTION OF BLADDER TUMOR)
Anesthesia: General

## 2015-01-05 NOTE — Progress Notes (Signed)
MRN: [id number] Name: Taylor Foley  Sex: female Age: 64 y.o. DOB: November 20, 1951  [place] #: [facility] Facility/Room:301 Level Of Care: SNF Provider: Inocencio Homes D Emergency Contacts: Extended Emergency Contact Information Primary Emergency Contact: [facility] Address: [address]          [place], [place] [address] Johnnette Litter of [place] Phone: [phone] Relation: Son Secondary Emergency Contact: Flemming,April  [place] Mobile Phone: [phone] Relation: None  Code Status: FULL  Allergies: Review of patient's allergies indicates no known allergies.  Chief Complaint  Patient presents with  . New Admit To SNF    HPI: Patient is 64 y.o. female who was admitted to the hospital for CP, neg w/u, found to have stage 4 bladder CA and poor social situation, admitted to SNF for generalized weakness and to work on good permanent placement.  Past Medical History  Diagnosis Date  . Hypertension   . Hypercholesteremia   . Syncope and collapse 11/2011; 03/14/2013  . Shortness of breath     "sometimes lying down" (03/14/2013)  . Blood in the urine     "here lately; q time I go to pee" (03/14/2013)  . Arthritis     "all over" (03/24/2013)  . Bladder tumor 12-2014  . Stage IV bladder cancer 01/08/2015    Past Surgical History  Procedure Laterality Date  . Orif wrist fracture  12/04/2011    Procedure: OPEN REDUCTION INTERNAL FIXATION (ORIF) WRIST FRACTURE;  Surgeon: Marybelle Killings;  Location: [facility];  Service: Orthopedics;  Laterality: Left;      Medication List       This list is accurate as of: 01/05/15 11:59 PM.  Always use your most recent med list.               bisacodyl 10 MG suppository  Commonly known as:  DULCOLAX  Place 10 mg rectally daily as needed for moderate constipation.     ciprofloxacin 500 MG tablet  Commonly known as:  CIPRO  Take 1 tablet (500 mg total) by mouth 2 (two) times daily. 5 more days     ibuprofen 200 MG tablet  Commonly known as:  ADVIL,MOTRIN  Take 600 mg by mouth every 8 (eight) hours as needed for moderate pain.     ibuprofen 600 MG tablet  Commonly known as:  ADVIL,MOTRIN  Take 1 tablet (600 mg total) by mouth every 8 (eight) hours as needed.     magnesium hydroxide 400 MG/5ML suspension  Commonly known as:  MILK OF MAGNESIA  Take 30 mLs by mouth daily as needed for mild constipation.     methylPREDNISolone 4 MG tablet  Commonly known as:  MEDROL DOSEPAK  follow package directions     RA SALINE ENEMA RE  Place 1 each rectally daily as needed (constipation).        No orders of the defined types were placed in this encounter.    Immunization History  Administered Date(s) Administered  . Tdap 03/14/2013    History  Substance Use Topics  . Smoking status: Never Smoker   . Smokeless tobacco: Never Used  . Alcohol Use: No    Family history is noncontributory    Review of Systems  DATA OBTAINED: from patient, nurse GENERAL:  no fevers, fatigue, appetite changes SKIN: No itching, rash or wounds EYES: No eye pain, redness, discharge EARS: No earache, tinnitus, change in hearing NOSE: No congestion, drainage or bleeding  MOUTH/THROAT: No mouth or tooth  pain, No sore throat RESPIRATORY: No cough, wheezing, SOB CARDIAC: No chest pain, palpitations, lower extremity edema  GI: No abdominal pain, No N/V/D or constipation, No heartburn or reflux  GU: No dysuria, frequency or urgency, or incontinence  MUSCULOSKELETAL: No unrelieved bone/joint pain NEUROLOGIC: No headache, dizziness or focal weakness PSYCHIATRIC: No overt anxiety or sadness   Filed Vitals:   01/05/15 1447  BP: 160/96  Pulse: 88  Temp: 97.4 F (36.3 C)  Resp: 22    Physical Exam  GENERAL APPEARANCE: Alert, conversant,  No acute distress.  SKIN: No diaphoresis rash HEAD: Normocephalic, atraumatic  EYES: Conjunctiva/lids clear. Pupils round, reactive. EOMs intact.  EARS:  External exam WNL, canals clear. Hearing grossly normal.  NOSE: No deformity or discharge.  MOUTH/THROAT: Lips w/o lesions  RESPIRATORY: Breathing is even, unlabored. Lung sounds are clear   CARDIOVASCULAR: Heart RRR no murmurs, rubs or gallops. No peripheral edema.   GASTROINTESTINAL: Abdomen is soft, non-tender, not distended w/ normal bowel sounds. GENITOURINARY: Bladder non tender, not distended  MUSCULOSKELETAL: No abnormal joints or musculature NEUROLOGIC:  Cranial nerves 2-12 grossly intact. Moves all extremities  PSYCHIATRIC: very odd affect  Patient Active Problem List   Diagnosis Date Noted  . Shoulder pain, right 01/08/2015  . Stage IV bladder cancer 01/08/2015  . UTI (urinary tract infection) 01/08/2015  . Refusal of care by patient 01/08/2015  . Acute encephalopathy   . Palliative care encounter   . Cancer related pain   . Confusion   . Adjustment disorder with mixed anxiety and depressed mood   . Chest pain 12/25/2014  . Liver lesion 12/25/2014  . Multiple lung nodules 12/25/2014  . Syncope 03/14/2013  . HTN (hypertension) 03/14/2013    CBC    Component Value Date/Time   WBC 6.7 12/27/2014 0259   RBC 3.57* 12/27/2014 0259   HGB 10.0* 12/27/2014 0259   HCT 30.2* 12/27/2014 0259   PLT 252 12/27/2014 0259   MCV 84.6 12/27/2014 0259   LYMPHSABS 1.8 12/25/2014 1811   MONOABS 0.7 12/25/2014 1811   EOSABS 0.2 12/25/2014 1811   BASOSABS 0.0 12/25/2014 1811    CMP     Component Value Date/Time   NA 137 12/27/2014 0259   K 4.1 12/27/2014 0259   CL 107 12/27/2014 0259   CO2 22 12/27/2014 0259   GLUCOSE 94 12/27/2014 0259   BUN 12 12/27/2014 0259   CREATININE 0.95 12/27/2014 0259   CALCIUM 8.2* 12/27/2014 0259   PROT 5.2* 12/27/2014 0259   ALBUMIN 2.4* 12/27/2014 0259   AST 33 12/27/2014 0259   ALT 22 12/27/2014 0259   ALKPHOS 60 12/27/2014 0259   BILITOT 0.3 12/27/2014 0259   GFRNONAA 62* 12/27/2014 0259   GFRAA 72* 12/27/2014 0259    Assessment and  Plan  Shoulder pain, right and hip pain. Likely due to advanced DJD. X-rays nonacute without any fracture. EKG stable. Placed on Motrin along with Medrol Dosepak for possible right trochanteric bursitis, one-time outpatient or to follow-up. Patient refusing all IV medications and further workup   UTI (urinary tract infection) 5 more days of oral Cipro. Refused IV   Stage IV bladder cancer cancer likely new diagnosis. Based on CT scan. Follow with urologist Dr. Alinda Money. Family is healthcare proxy. Outpatient urology and oncology follow-up.   Refusal of care by patient Seen by psych, deemed competent for social decisions but not for medical decisions. Family is healthcare proxy. She does have evidence of severe cerebral atrophy on MRI. Neurology was requested  to evaluate. For now supportive care only. She does not have history of alcohol abuse. Patient neurology workup should be continued     Hennie Duos, MD

## 2015-01-05 NOTE — Progress Notes (Signed)
Patient ID: Taylor Foley, female   DOB: Jul 22, 1951, 64 y.o.   MRN: [id number]    Ms. Faxon was scheduled for cystoscopy and TURBT this morning. She apparently became combative and refused to be transported from her SNF to the hospital for surgery. Her son was notified and could not be present to assist her this morning. Therefore, her surgery had to be cancelled. During her recent hospitalization, she was deemed to be incapable of making medical decisions based on her inpatient psychiatric evaluation. After meeting with her, her son Elta [place], and the palliative care team, she agreed to make her son her healthcare power of attorney and to allow him to make medical decisions for her. Her surgery will need to be rescheduled when he can be present.

## 2015-01-06 ENCOUNTER — Non-Acute Institutional Stay (SKILLED_NURSING_FACILITY): Payer: Medicaid Other | Admitting: Internal Medicine

## 2015-01-06 ENCOUNTER — Encounter: Payer: Self-pay | Admitting: Internal Medicine

## 2015-01-06 DIAGNOSIS — C679 Malignant neoplasm of bladder, unspecified: Secondary | ICD-10-CM

## 2015-01-06 DIAGNOSIS — N3000 Acute cystitis without hematuria: Secondary | ICD-10-CM | POA: Diagnosis not present

## 2015-01-06 DIAGNOSIS — M25511 Pain in right shoulder: Secondary | ICD-10-CM | POA: Diagnosis not present

## 2015-01-06 DIAGNOSIS — Z5329 Procedure and treatment not carried out because of patient's decision for other reasons: Secondary | ICD-10-CM | POA: Diagnosis not present

## 2015-01-08 ENCOUNTER — Encounter: Payer: Self-pay | Admitting: Internal Medicine

## 2015-01-08 DIAGNOSIS — C679 Malignant neoplasm of bladder, unspecified: Secondary | ICD-10-CM

## 2015-01-08 DIAGNOSIS — Z5329 Procedure and treatment not carried out because of patient's decision for other reasons: Secondary | ICD-10-CM | POA: Insufficient documentation

## 2015-01-08 DIAGNOSIS — M25511 Pain in right shoulder: Secondary | ICD-10-CM | POA: Insufficient documentation

## 2015-01-08 DIAGNOSIS — N390 Urinary tract infection, site not specified: Secondary | ICD-10-CM | POA: Insufficient documentation

## 2015-01-08 HISTORY — DX: Malignant neoplasm of bladder, unspecified: C67.9

## 2015-01-08 NOTE — Assessment & Plan Note (Addendum)
Seen by psych, deemed competent for social decisions but not for medical decisions. Family is healthcare proxy. She does have evidence of severe cerebral atrophy on MRI. Neurology was requested to evaluate. For now supportive care only. She does not have history of alcohol abuse. Out pt neurology, urology and ortho has been ordered from SNF.

## 2015-01-08 NOTE — Progress Notes (Signed)
MRN: [id number] Name: Taylor Foley  Sex: female Age: 64 y.o. DOB: 1951/09/18  [place] #: [facility] Facility/Room:301 Level Of Care: SNF Provider: Inocencio Homes D Emergency Contacts: Extended Emergency Contact Information Primary Emergency Contact: [facility] Address: [address]          [place], [place] [address] Johnnette Litter of [place] Phone: [phone] Relation: Son Secondary Emergency Contact: Flemming,April  [place] Mobile Phone: [phone] Relation: None  Code Status: FULL  Allergies: Review of patient's allergies indicates no known allergies.  Chief Complaint  Patient presents with  . Discharge Note    HPI: Patient is 64 y.o. female who was admitted to SNF with new dx of stage 4 bladder cancer for generalized weakness. Pt has refused care here and refused urology appointment we made for her.  Past Medical History  Diagnosis Date  . Hypertension   . Hypercholesteremia   . Syncope and collapse 11/2011; 03/14/2013  . Shortness of breath     "sometimes lying down" (03/14/2013)  . Blood in the urine     "here lately; q time I go to pee" (03/14/2013)  . Arthritis     "all over" (03/24/2013)  . Bladder tumor 12-2014  . Stage IV bladder cancer 01/08/2015    Past Surgical History  Procedure Laterality Date  . Orif wrist fracture  12/04/2011    Procedure: OPEN REDUCTION INTERNAL FIXATION (ORIF) WRIST FRACTURE;  Surgeon: Marybelle Killings;  Location: [facility];  Service: Orthopedics;  Laterality: Left;      Medication List       This list is accurate as of: 01/06/15 11:59 PM.  Always use your most recent med list.               bisacodyl 10 MG suppository  Commonly known as:  DULCOLAX  Place 10 mg rectally daily as needed for moderate constipation.     ibuprofen 200 MG tablet  Commonly known as:  ADVIL,MOTRIN  Take 600 mg by mouth every 8 (eight) hours as needed for moderate pain.     ibuprofen 600 MG tablet  Commonly  known as:  ADVIL,MOTRIN  Take 1 tablet (600 mg total) by mouth every 8 (eight) hours as needed.     magnesium hydroxide 400 MG/5ML suspension  Commonly known as:  MILK OF MAGNESIA  Take 30 mLs by mouth daily as needed for mild constipation.     methylPREDNISolone 4 MG tablet  Commonly known as:  MEDROL DOSEPAK  follow package directions     RA SALINE ENEMA RE  Place 1 each rectally daily as needed (constipation).        No orders of the defined types were placed in this encounter.    Immunization History  Administered Date(s) Administered  . Tdap 03/14/2013    History  Substance Use Topics  . Smoking status: Never Smoker   . Smokeless tobacco: Never Used  . Alcohol Use: No    Filed Vitals:   01/06/15 1648  BP: 160/96  Pulse: 88  Temp: 97.4 F (36.3 C)  Resp: 22    Physical Exam  GENERAL APPEARANCE: Alert, conversant. No acute distress.  HEENT: Unremarkable. RESPIRATORY: Breathing is even, unlabored. Lung sounds are clear   CARDIOVASCULAR: Heart RRR no murmurs, rubs or gallops. No peripheral edema.  GASTROINTESTINAL: Abdomen is soft, non-tender, not distended w/ normal bowel sounds.  NEUROLOGIC: Cranial nerves 2-12 grossly intact. Moves all extremities  Patient Active Problem List   Diagnosis Date  Noted  . Shoulder pain, right 01/08/2015  . Stage IV bladder cancer 01/08/2015  . UTI (urinary tract infection) 01/08/2015  . Refusal of care by patient 01/08/2015  . Acute encephalopathy   . Palliative care encounter   . Cancer related pain   . Confusion   . Adjustment disorder with mixed anxiety and depressed mood   . Chest pain 12/25/2014  . Liver lesion 12/25/2014  . Multiple lung nodules 12/25/2014  . Syncope 03/14/2013  . HTN (hypertension) 03/14/2013    CBC    Component Value Date/Time   WBC 6.7 12/27/2014 0259   RBC 3.57* 12/27/2014 0259   HGB 10.0* 12/27/2014 0259   HCT 30.2* 12/27/2014 0259   PLT 252 12/27/2014 0259   MCV 84.6 12/27/2014  0259   LYMPHSABS 1.8 12/25/2014 1811   MONOABS 0.7 12/25/2014 1811   EOSABS 0.2 12/25/2014 1811   BASOSABS 0.0 12/25/2014 1811    CMP     Component Value Date/Time   NA 137 12/27/2014 0259   K 4.1 12/27/2014 0259   CL 107 12/27/2014 0259   CO2 22 12/27/2014 0259   GLUCOSE 94 12/27/2014 0259   BUN 12 12/27/2014 0259   CREATININE 0.95 12/27/2014 0259   CALCIUM 8.2* 12/27/2014 0259   PROT 5.2* 12/27/2014 0259   ALBUMIN 2.4* 12/27/2014 0259   AST 33 12/27/2014 0259   ALT 22 12/27/2014 0259   ALKPHOS 60 12/27/2014 0259   BILITOT 0.3 12/27/2014 0259   GFRNONAA 62* 12/27/2014 0259   GFRAA 72* 12/27/2014 0259    Assessment and Plan  Pt is stable for discharge to home with family. She has refused the care that has been offered here and set up for her. She has some sort of psych issue or maybe just dementia , or both. Having her to accept the care she needs will be a challenge.  No problem-specific assessment & plan notes found for this encounter.  Pt seen 2//01/2015 Hennie Duos, MD

## 2015-01-08 NOTE — Assessment & Plan Note (Signed)
Not controlled here. Pt has refused all treatment. It will be an ongoing problem to get pt on board with what she needs.

## 2015-01-08 NOTE — Assessment & Plan Note (Signed)
and hip pain. Likely due to advanced DJD. X-rays nonacute without any fracture. EKG stable. Placed on Motrin along with Medrol Dosepak for possible right trochanteric bursitis, one-time outpatient or to follow-up. Patient refusing all IV medications and further workup

## 2015-01-08 NOTE — Progress Notes (Signed)
This encounter was created in error - please disregard.

## 2015-01-08 NOTE — Assessment & Plan Note (Signed)
5 more days of oral Cipro. Refused IV

## 2015-01-08 NOTE — Assessment & Plan Note (Signed)
cancer likely new diagnosis. Based on CT scan. Follow with urologist Dr. Alinda Money. Family is healthcare proxy. Outpatient urology and oncology follow-up.

## 2015-01-14 ENCOUNTER — Other Ambulatory Visit: Payer: Self-pay

## 2015-01-14 ENCOUNTER — Other Ambulatory Visit: Payer: Self-pay | Admitting: Urology

## 2015-01-14 ENCOUNTER — Encounter (HOSPITAL_COMMUNITY): Payer: Self-pay | Admitting: Emergency Medicine

## 2015-01-14 ENCOUNTER — Emergency Department (HOSPITAL_COMMUNITY): Payer: Medicaid Other

## 2015-01-14 ENCOUNTER — Inpatient Hospital Stay (HOSPITAL_COMMUNITY)
Admission: EM | Admit: 2015-01-14 | Discharge: 2015-02-03 | DRG: 872 | Disposition: E | Payer: Medicaid Other | Attending: Internal Medicine | Admitting: Internal Medicine

## 2015-01-14 DIAGNOSIS — A0472 Enterocolitis due to Clostridium difficile, not specified as recurrent: Secondary | ICD-10-CM | POA: Diagnosis present

## 2015-01-14 DIAGNOSIS — C679 Malignant neoplasm of bladder, unspecified: Secondary | ICD-10-CM | POA: Diagnosis present

## 2015-01-14 DIAGNOSIS — E876 Hypokalemia: Secondary | ICD-10-CM | POA: Diagnosis present

## 2015-01-14 DIAGNOSIS — G8929 Other chronic pain: Secondary | ICD-10-CM | POA: Diagnosis present

## 2015-01-14 DIAGNOSIS — N179 Acute kidney failure, unspecified: Secondary | ICD-10-CM | POA: Diagnosis present

## 2015-01-14 DIAGNOSIS — Z8041 Family history of malignant neoplasm of ovary: Secondary | ICD-10-CM

## 2015-01-14 DIAGNOSIS — C7902 Secondary malignant neoplasm of left kidney and renal pelvis: Secondary | ICD-10-CM | POA: Diagnosis present

## 2015-01-14 DIAGNOSIS — F4323 Adjustment disorder with mixed anxiety and depressed mood: Secondary | ICD-10-CM | POA: Diagnosis present

## 2015-01-14 DIAGNOSIS — D649 Anemia, unspecified: Secondary | ICD-10-CM | POA: Diagnosis present

## 2015-01-14 DIAGNOSIS — Z833 Family history of diabetes mellitus: Secondary | ICD-10-CM

## 2015-01-14 DIAGNOSIS — I10 Essential (primary) hypertension: Secondary | ICD-10-CM | POA: Diagnosis present

## 2015-01-14 DIAGNOSIS — E780 Pure hypercholesterolemia: Secondary | ICD-10-CM | POA: Diagnosis present

## 2015-01-14 DIAGNOSIS — A047 Enterocolitis due to Clostridium difficile: Secondary | ICD-10-CM | POA: Diagnosis present

## 2015-01-14 DIAGNOSIS — M1990 Unspecified osteoarthritis, unspecified site: Secondary | ICD-10-CM | POA: Diagnosis present

## 2015-01-14 DIAGNOSIS — C7971 Secondary malignant neoplasm of right adrenal gland: Secondary | ICD-10-CM | POA: Diagnosis present

## 2015-01-14 DIAGNOSIS — C7801 Secondary malignant neoplasm of right lung: Secondary | ICD-10-CM | POA: Diagnosis present

## 2015-01-14 DIAGNOSIS — R109 Unspecified abdominal pain: Secondary | ICD-10-CM

## 2015-01-14 DIAGNOSIS — D696 Thrombocytopenia, unspecified: Secondary | ICD-10-CM | POA: Diagnosis present

## 2015-01-14 DIAGNOSIS — Z8249 Family history of ischemic heart disease and other diseases of the circulatory system: Secondary | ICD-10-CM

## 2015-01-14 DIAGNOSIS — C7802 Secondary malignant neoplasm of left lung: Secondary | ICD-10-CM | POA: Diagnosis present

## 2015-01-14 DIAGNOSIS — C787 Secondary malignant neoplasm of liver and intrahepatic bile duct: Secondary | ICD-10-CM | POA: Diagnosis present

## 2015-01-14 DIAGNOSIS — C7901 Secondary malignant neoplasm of right kidney and renal pelvis: Secondary | ICD-10-CM | POA: Diagnosis present

## 2015-01-14 DIAGNOSIS — A419 Sepsis, unspecified organism: Principal | ICD-10-CM | POA: Diagnosis present

## 2015-01-14 DIAGNOSIS — N390 Urinary tract infection, site not specified: Secondary | ICD-10-CM | POA: Diagnosis present

## 2015-01-14 DIAGNOSIS — E860 Dehydration: Secondary | ICD-10-CM | POA: Diagnosis present

## 2015-01-14 DIAGNOSIS — Z66 Do not resuscitate: Secondary | ICD-10-CM | POA: Diagnosis present

## 2015-01-14 DIAGNOSIS — D638 Anemia in other chronic diseases classified elsewhere: Secondary | ICD-10-CM | POA: Diagnosis present

## 2015-01-14 DIAGNOSIS — Z515 Encounter for palliative care: Secondary | ICD-10-CM

## 2015-01-14 DIAGNOSIS — R319 Hematuria, unspecified: Secondary | ICD-10-CM | POA: Diagnosis present

## 2015-01-14 HISTORY — DX: Acute kidney failure, unspecified: N17.9

## 2015-01-14 LAB — URINE MICROSCOPIC-ADD ON

## 2015-01-14 LAB — I-STAT TROPONIN, ED: Troponin i, poc: 0.03 ng/mL (ref 0.00–0.08)

## 2015-01-14 LAB — CBC
HEMATOCRIT: 26 % — AB (ref 36.0–46.0)
Hemoglobin: 8.7 g/dL — ABNORMAL LOW (ref 12.0–15.0)
MCH: 26.9 pg (ref 26.0–34.0)
MCHC: 33.5 g/dL (ref 30.0–36.0)
MCV: 80.5 fL (ref 78.0–100.0)
Platelets: 111 10*3/uL — ABNORMAL LOW (ref 150–400)
RBC: 3.23 MIL/uL — AB (ref 3.87–5.11)
RDW: 15.6 % — ABNORMAL HIGH (ref 11.5–15.5)
WBC: 20 10*3/uL — AB (ref 4.0–10.5)

## 2015-01-14 LAB — URINALYSIS, ROUTINE W REFLEX MICROSCOPIC
GLUCOSE, UA: NEGATIVE mg/dL
KETONES UR: NEGATIVE mg/dL
Nitrite: NEGATIVE
PH: 6.5 (ref 5.0–8.0)
Protein, ur: >300 mg/dL — AB
Specific Gravity, Urine: 1.028 (ref 1.005–1.030)
Urobilinogen, UA: 1 mg/dL (ref 0.0–1.0)

## 2015-01-14 LAB — COMPREHENSIVE METABOLIC PANEL
ALT: 98 U/L — ABNORMAL HIGH (ref 0–35)
ANION GAP: 9 (ref 5–15)
AST: 143 U/L — ABNORMAL HIGH (ref 0–37)
Albumin: 2 g/dL — ABNORMAL LOW (ref 3.5–5.2)
Alkaline Phosphatase: 145 U/L — ABNORMAL HIGH (ref 39–117)
BILIRUBIN TOTAL: 1.2 mg/dL (ref 0.3–1.2)
BUN: 56 mg/dL — AB (ref 6–23)
CO2: 17 mmol/L — ABNORMAL LOW (ref 19–32)
CREATININE: 1.58 mg/dL — AB (ref 0.50–1.10)
Calcium: 7.5 mg/dL — ABNORMAL LOW (ref 8.4–10.5)
Chloride: 109 mmol/L (ref 96–112)
GFR, EST AFRICAN AMERICAN: 39 mL/min — AB (ref >90)
GFR, EST NON AFRICAN AMERICAN: 34 mL/min — AB (ref >90)
Glucose, Bld: 112 mg/dL — ABNORMAL HIGH (ref 70–99)
Potassium: 3.4 mmol/L — ABNORMAL LOW (ref 3.5–5.1)
SODIUM: 135 mmol/L (ref 135–145)
Total Protein: 5 g/dL — ABNORMAL LOW (ref 6.0–8.3)

## 2015-01-14 LAB — PROTIME-INR
INR: 1.54 — ABNORMAL HIGH (ref 0.00–1.49)
Prothrombin Time: 18.7 seconds — ABNORMAL HIGH (ref 11.6–15.2)

## 2015-01-14 MED ORDER — IOHEXOL 350 MG/ML SOLN
100.0000 mL | Freq: Once | INTRAVENOUS | Status: AC | PRN
  Administered 2015-01-14: 80 mL via INTRAVENOUS

## 2015-01-14 MED ORDER — SODIUM CHLORIDE 0.9 % IV BOLUS (SEPSIS)
1000.0000 mL | Freq: Once | INTRAVENOUS | Status: AC
  Administered 2015-01-14: 1000 mL via INTRAVENOUS

## 2015-01-14 NOTE — Progress Notes (Addendum)
CSW met with pt at bedside, who states that she comes to the ED because she has recently been blacking out and sleeping more that usual. Pt informed CSW that she comes from home, but lives alone. Pt appears to have family support. However, she does not have any insurance.  Pt states that she was at a facility previously [facility]), but was kicked out due to not paying. Patient states "I didn't know I had to pay." CSW consulted with physician who states that labs are still being conducted for pt and it is not certain if she will be admitted.  Willette Brace [phone] ED CSW 01/10/2015 11:06 PM

## 2015-01-14 NOTE — ED Notes (Signed)
Pt c/o dizziness x 3-4 days. 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 Pt refused biopsy.Pt sts she didn't know she had cancer, but per family, pt has been confused the last 10 months. Pt was at [facility] but was sent away due to lack of funds. Pt does not qualify for medicaid. Pt A&O to baseline, per family. Pt lives alone.

## 2015-01-14 NOTE — Progress Notes (Signed)
  CARE MANAGEMENT ED NOTE 01/13/2015  Patient:  Taylor Foley, Taylor Foley   Account Number:  [id number]  Date Initiated:  01/30/2015  Documentation initiated by:  Livia Snellen  Subjective/Objective Assessment:   Patient presents to Ed with dizziness     Subjective/Objective Assessment Detail:   Patient with pmhx of HTN, Hypercholersteremia, arthritis, bladder tumor, stage 4 bladder cancer     Action/Plan:   Action/Plan Detail:   Anticipated DC Date:       Status Recommendation to Physician:   Result of Recommendation:    Other ED Services  Consult Working [place]  CM consult  Other  PCP issues    Choice offered to / List presented to:  C-4 Adult Children          Status of service:  Completed, signed off  ED Comments:   ED Comments Detail:  [facility] received consult from Dr. Colin Rhein regarding home health services. Discussed patient with Dr. Colin Rhein. Patient listed as having Medicaid insurance without pcp.  EDCM went to speak to patient at bedside, however, patient currently being tended too.  EDCM called patient's son Elta Guadeloupe who is the patient's POA.  Elta Guadeloupe confirms the patient lives alone. "I go and check in on her every day."  Elta Guadeloupe confirms patient''s sister lives next door to her.  Elta Guadeloupe reports the patient is scheduled to go to the Urologist to have a bladder biopsy on Feb 22nd.  Elta Guadeloupe reports they are waiting on the results of the biopsy to decide what the next step is.  Elta Guadeloupe reports the patient does nto have a pcp or an oncologist.  Elta Guadeloupe also reports the patient does not have insurance, as she is Medicaid pending.  Elta Guadeloupe reports patient was denied by disability.  Elta Guadeloupe reports he feels the best place for the patient is in a facility.  EDCM offered referral for [facility] for visiting RN and possible SW, infomation regarding [facility] [employer] program, establishing pcp at [facility] and making referral to Cancer center.  Patient agreeable to above plan.  EDCM left vm for Santiago Glad at [facility]  requesting appointment to establish care.  EDCM placed email to navigator at Cancer center in attempts to shcedule an appointment with oncologist.  Discussed patient with EDSW.  No further EDCM needs at this time.

## 2015-01-14 NOTE — ED Provider Notes (Signed)
CSN: [id number]     Arrival date & time 01/12/2015  1624 History   First MD Initiated Contact with Patient 01/18/2015 1649     Chief Complaint  Patient presents with  . Dizziness     (Consider location/radiation/quality/duration/timing/severity/associated sxs/prior Treatment) Patient is a 64 y.o. female presenting with abdominal pain.  Abdominal Pain Pain location:  Generalized Pain quality: sharp   Pain radiates to:  Does not radiate Pain severity:  Severe Onset quality:  Gradual Timing:  Constant Progression:  Unchanged Chronicity:  New Context comment:  Known cancer to abd, lung with ongoing wu and scheduled bladder biopsy for suspected primary bladder ca Relieved by:  Nothing Worsened by:  Movement and palpation Ineffective treatments:  None tried Associated symptoms: chills, diarrhea, nausea and vomiting   Associated symptoms: no cough   Associated symptoms comment:  Lightheadedness on standing   Past Medical History  Diagnosis Date  . Hypertension   . Hypercholesteremia   . Syncope and collapse 11/2011; 03/14/2013  . Shortness of breath     "sometimes lying down" (03/14/2013)  . Blood in the urine     "here lately; q time I go to pee" (03/14/2013)  . Arthritis     "all over" (03/24/2013)  . Bladder tumor 12-2014  . Stage IV bladder cancer 01/08/2015  . AKI (acute kidney injury) 01/15/2015   Past Surgical History  Procedure Laterality Date  . Orif wrist fracture  12/04/2011    Procedure: OPEN REDUCTION INTERNAL FIXATION (ORIF) WRIST FRACTURE;  Surgeon: Marybelle Killings;  Location: [facility];  Service: Orthopedics;  Laterality: Left;   Family History  Problem Relation Age of Onset  . Congestive Heart Failure Father   . Hypertension Father   . Hyperlipidemia Father   . Diabetes Other   . Heart disease Other   . Prostate cancer Father   . Ovarian cancer Mother   . Hypertension Mother    History  Substance Use Topics  . Smoking status: Never Smoker   . Smokeless tobacco:  Never Used  . Alcohol Use: No   OB History    No data available     Review of Systems  Constitutional: Positive for chills.  Respiratory: Negative for cough.   Gastrointestinal: Positive for nausea, vomiting, abdominal pain and diarrhea.  All other systems reviewed and are negative.     Allergies  Review of patient's allergies indicates no known allergies.  Home Medications   Prior to Admission medications   Medication Sig Start Date End Date Taking? Authorizing Provider  bisacodyl (DULCOLAX) 10 MG suppository Place 10 mg rectally daily as needed for moderate constipation.    Historical Provider, MD  ibuprofen (ADVIL,MOTRIN) 200 MG tablet Take 600 mg by mouth every 8 (eight) hours as needed for moderate pain.    Historical Provider, MD  ibuprofen (ADVIL,MOTRIN) 600 MG tablet Take 1 tablet (600 mg total) by mouth every 8 (eight) hours as needed. Patient taking differently: Take 600 mg by mouth every 8 (eight) hours as needed. For pain. 12/31/14   Thurnell Lose, MD  magnesium hydroxide (MILK OF MAGNESIA) 400 MG/5ML suspension Take 30 mLs by mouth daily as needed for mild constipation.    Historical Provider, MD  methylPREDNISolone (MEDROL DOSEPAK) 4 MG tablet follow package directions Patient not taking: Reported on 01/16/2015 12/31/14   Thurnell Lose, MD  Sodium Phosphates (RA SALINE ENEMA RE) Place 1 each rectally daily as needed (constipation).    Historical Provider, MD   BP 131/95  mmHg  Pulse 102  Temp(Src) 98.8 F (37.1 C) (Oral)  Resp 18  Ht 5\' 6"  (1.676 m)  Wt 162 lb 0.6 oz (73.5 kg)  BMI 26.17 kg/m2  SpO2 98% Physical Exam  Constitutional: She is oriented to person, place, and time. She appears well-developed and well-nourished.  HENT:  Head: Normocephalic and atraumatic.  Right Ear: External ear normal.  Left Ear: External ear normal.  Eyes: Conjunctivae and EOM are normal. Pupils are equal, round, and reactive to light.  Neck: Normal range of motion. Neck  supple.  Cardiovascular: Normal rate, regular rhythm, normal heart sounds and intact distal pulses.   Pulmonary/Chest: Effort normal and breath sounds normal.  Abdominal: Soft. Bowel sounds are normal. There is generalized tenderness.  Musculoskeletal: Normal range of motion.  Neurological: She is alert and oriented to person, place, and time.  Skin: Skin is warm and dry.  Vitals reviewed.   ED Course  Procedures (including critical care time) Labs Review Labs Reviewed  CLOSTRIDIUM DIFFICILE BY PCR - Abnormal; Notable for the following:    C difficile by pcr POSITIVE (*)    All other components within normal limits  CBC - Abnormal; Notable for the following:    WBC 20.0 (*)    RBC 3.23 (*)    Hemoglobin 8.7 (*)    HCT 26.0 (*)    RDW 15.6 (*)    Platelets 111 (*)    All other components within normal limits  PROTIME-INR - Abnormal; Notable for the following:    Prothrombin Time 18.7 (*)    INR 1.54 (*)    All other components within normal limits  URINALYSIS, ROUTINE W REFLEX MICROSCOPIC - Abnormal; Notable for the following:    Color, Urine AMBER (*)    APPearance CLOUDY (*)    Hgb urine dipstick LARGE (*)    Bilirubin Urine MODERATE (*)    Protein, ur >300 (*)    Leukocytes, UA SMALL (*)    All other components within normal limits  COMPREHENSIVE METABOLIC PANEL - Abnormal; Notable for the following:    Potassium 3.4 (*)    CO2 17 (*)    Glucose, Bld 112 (*)    BUN 56 (*)    Creatinine, Ser 1.58 (*)    Calcium 7.5 (*)    Total Protein 5.0 (*)    Albumin 2.0 (*)    AST 143 (*)    ALT 98 (*)    Alkaline Phosphatase 145 (*)    GFR calc non Af Amer 34 (*)    GFR calc Af Amer 39 (*)    All other components within normal limits  URINE MICROSCOPIC-ADD ON - Abnormal; Notable for the following:    Bacteria, UA FEW (*)    Casts GRANULAR CAST (*)    All other components within normal limits  COMPREHENSIVE METABOLIC PANEL - Abnormal; Notable for the following:    CO2 17  (*)    Glucose, Bld 105 (*)    BUN 50 (*)    Creatinine, Ser 1.53 (*)    Calcium 7.4 (*)    Total Protein 4.9 (*)    Albumin 1.9 (*)    AST 151 (*)    ALT 98 (*)    Alkaline Phosphatase 135 (*)    GFR calc non Af Amer 35 (*)    GFR calc Af Amer 40 (*)    All other components within normal limits  CBC - Abnormal; Notable for the following:  WBC 14.9 (*)    RBC 2.64 (*)    Hemoglobin 7.2 (*)    HCT 21.1 (*)    RDW 15.9 (*)    Platelets 64 (*)    All other components within normal limits  GLUCOSE, CAPILLARY - Abnormal; Notable for the following:    Glucose-Capillary 106 (*)    All other components within normal limits  URINE CULTURE  CULTURE, BLOOD (ROUTINE X 2)  CULTURE, BLOOD (ROUTINE X 2)  CREATININE, URINE, RANDOM  SODIUM, URINE, RANDOM  LACTIC ACID, PLASMA  PROCALCITONIN  GI PATHOGEN PANEL BY PCR, STOOL  PATHOLOGIST SMEAR REVIEW  I-STAT TROPOININ, ED    Imaging Review Ct Abdomen Pelvis Wo Contrast  01/18/2015   CLINICAL DATA:  Dizziness for 4 days. Cancer in stomach, liver, and lung. Confusion. New acute abdominal pain for 1 day.  EXAM: CT ABDOMEN AND PELVIS WITHOUT CONTRAST  TECHNIQUE: Multidetector CT imaging of the abdomen and pelvis was performed following the standard protocol without IV contrast.  COMPARISON:  12/26/2014  FINDINGS: Lung bases demonstrate multiple pulmonary nodules compatible with metastatic disease. Cardiac enlargement with small pericardial effusion.  There is residual contrast material in the kidneys and renal collecting systems from previous CT angio of the chest from earlier today.  Normal multiple diffuse low-attenuation lesions demonstrated throughout the liver, better seen on previous contrast enhanced study. This is consistent with diffuse hepatic metastasis. Multiple solid masses demonstrated in both kidneys consistent with diffuse metastatic disease in the kidneys. No hydronephrosis. Spleen and pancreas are unremarkable. Right adrenal gland  nodule measuring 2.4 cm diameter, likely metastatic. Gallbladder, abdominal aorta, and inferior vena cava are unremarkable. Scattered retroperitoneal and mesenteric lymph nodes are not pathologically enlarged. No free fluid or free air in the abdomen. Stomach, small bowel, and colon are decompressed. No evidence of obstruction but lack of distention limits evaluation for wall thickening.  Pelvis: Heterogeneous thickening of the bladder wall probably represents metastatic disease although primary or inflammatory process could also have this appearance. Uterus and ovaries are not enlarged. Small amount of free fluid in the pelvis. Appendix is not identified. No significant pelvic lymphadenopathy. Degenerative changes in the spine and hips. No destructive bone lesions are appreciated.  IMPRESSION: Diffuse metastatic disease throughout the liver and kidneys as well as right adrenal gland. There is progression since previous study. Small amount of free fluid in the pelvis is likely related to metastatic disease. Asymmetric heterogeneous thickening of the bladder wall may represent metastatic disease although primary lesion or inflammatory process could also have this appearance. No evidence of bowel obstruction.   Electronically Signed   By: Lucienne Capers M.D.   On: 01/09/2015 23:58   Dg Chest 2 View  01/30/2015   CLINICAL DATA:  Dizziness for 4 days. Left-sided chest pain. History of hypertension. Cancer in the stomach, liver, and lung.  EXAM: CHEST  2 VIEW  COMPARISON:  12/25/2014  FINDINGS: Normal heart size and pulmonary vascularity. Vague diffuse nodular parenchymal pattern to the lungs again demonstrated. This corresponds to known pulmonary metastasis. No focal consolidation or atelectasis. No blunting of costophrenic angles. No pneumothorax. Degenerative changes in the spine.  IMPRESSION: Vague nodular parenchymal pattern consistent with known metastatic disease. No evidence of acute consolidation. Normal  heart size.   Electronically Signed   By: Lucienne Capers M.D.   On: 01/18/2015 18:39   Ct Angio Chest Pe W/cm &/or Wo Cm  01/09/2015   CLINICAL DATA:  Dizziness. Recent diagnosis of stage IV bladder carcinoma  with metastatic disease involving the lungs and liver.  EXAM: CT ANGIOGRAPHY CHEST WITH CONTRAST  TECHNIQUE: Multidetector CT imaging of the chest was performed using the standard protocol during bolus administration of intravenous contrast. Multiplanar CT image reconstructions and MIPs were obtained to evaluate the vascular anatomy.  CONTRAST:  24mL OMNIPAQUE IOHEXOL 350 MG/ML SOLN  COMPARISON:  12/25/2014 chest CT and 12/26/2014 abdominal and pelvic CT  FINDINGS: There is progression of metastatic disease throughout the chest since the prior study. Lymphadenopathy in the right axilla, bilateral hilar regions and subcarinal mediastinum has progressed since the prior study. A right hilar lymph node mass shows substantial enlargement and measures 2.3 x 2.9 cm. This previously measured 1.3 x 1.8 cm. Subcarinal lymphadenopathy measures 1.3 cm in short axis. Multiple left hilar lymph nodes appear larger compared to the prior study.  Innumerable bilateral pulmonary metastatic nodules are again identified. All of the metastatic nodules have increased slightly in size since the prior study and multiple nodules demonstrate cavitation. Large nodule in the right upper lobe measures approximately 1.4 cm in greatest diameter. Largest nodule in the lingula of measures approximately 1.5 cm in greatest diameter.  No evidence of acute pulmonary embolism. The thoracic aorta is normal in caliber. No pleural or pericardial fluid is seen. The heart size is normal. No visible bony metastases or fractures.  The visualized upper abdomen shows significant progression of metastatic disease in the liver since a prior CT of the abdomen on 12/26/2014. Diffuse lesions throughout both lobes of the liver show significant enlargement.  There now is confluent metastatic disease in the posterior right lobe of the liver over an area of measuring roughly 7 x 9 cm. Near the dome of the liver anteriorly, a dominant index lesion has increased in diameter from approximately 2.8 cm to 3.9 cm. No visible ascites in the upper abdomen.  Review of the MIP images confirms the above findings.  IMPRESSION: 1. No evidence of pulmonary embolism. 2. Significant progression of metastatic disease in the chest and liver. In the chest, metastatic lymphadenopathy has significantly increased in size and innumerable bilateral pulmonary metastatic nodules have also all increased in size. Many cavitary nodules are again identified. Diffuse metastatic disease throughout the visualized liver has worsened substantially since the prior CT studies.   Electronically Signed   By: Aletta Edouard M.D.   On: 01/23/2015 20:36     EKG Interpretation None      MDM   Final diagnoses:  Abdominal pain, acute    64 y.o. female with pertinent PMH of likely metastatic bladder ca with ongoing wu presents with acute on chronic abdominal pain, lightheadedness on standing. Patient was just discharged from skilled nursing facility recently due to inability to pay. Symptoms have questionably worsened, however the primary issue is that she is unable to care for self at home. No recent fevers. The patient has extremely poor insight into her own conditions, at times forgets that she has cancer. She has been evaluated by neurology and psychiatry, felt to have decision-making capacity at this time, however her son was made healthcare power of attorney.  I spoke with her son regarding the patient's condition and per his report he would like the patient to be full code and to have all means pursued to identify if the patient could be treated. On arrival today vitals signs and physical exam as above. Patient oriented 3. Abdomen generally tender. Patient tachycardic with dyspnea, no  tachypnea. Obtain CT scan of the chest which was  unremarkable for PE. CT abd also unremarkable for acute pathology.  UA with UTI.  Likely multifactorial decrease in functional status.  Feel that pt warrants admission for IV abx.  Admitted in stable condition.    I have reviewed all laboratory and imaging studies if ordered as above  1. Abdominal pain, acute   2. ARF (acute renal failure)         Debby Freiberg, MD 01/15/15 [phone]

## 2015-01-14 NOTE — ED Notes (Signed)
Patient on bed pan. Patient is trying to urinate.

## 2015-01-14 NOTE — ED Notes (Signed)
Patient transported to CT 

## 2015-01-14 NOTE — Progress Notes (Signed)
01/19/2015 A. Lezli Danek RNCM 2330pm EDCM confirmed patient's address and phone number with patient's son Elta Guadeloupe for possible home health services with [facility].  EDCM placed contact information for [facility], senior resources of [place], [place], private duty nursing agencies, pamphlet to [facility], and application for [employer] in [place] envelope and placed at patient's bedside with patient's initals, room number and patient's son Mark's name on it.  Home health orders for RN and SW faxed to [facility] at 2320pm with confirmation of receipt at 2323pm.  No further EDCM needs at this time.

## 2015-01-15 ENCOUNTER — Encounter (HOSPITAL_COMMUNITY): Payer: Self-pay | Admitting: Internal Medicine

## 2015-01-15 ENCOUNTER — Telehealth: Payer: Self-pay | Admitting: Oncology

## 2015-01-15 ENCOUNTER — Inpatient Hospital Stay (HOSPITAL_COMMUNITY): Payer: Medicaid Other

## 2015-01-15 DIAGNOSIS — A0472 Enterocolitis due to Clostridium difficile, not specified as recurrent: Secondary | ICD-10-CM | POA: Diagnosis present

## 2015-01-15 DIAGNOSIS — R109 Unspecified abdominal pain: Secondary | ICD-10-CM | POA: Insufficient documentation

## 2015-01-15 DIAGNOSIS — A047 Enterocolitis due to Clostridium difficile: Secondary | ICD-10-CM | POA: Diagnosis not present

## 2015-01-15 DIAGNOSIS — D649 Anemia, unspecified: Secondary | ICD-10-CM | POA: Diagnosis not present

## 2015-01-15 DIAGNOSIS — R100 Acute abdomen: Secondary | ICD-10-CM

## 2015-01-15 DIAGNOSIS — Z833 Family history of diabetes mellitus: Secondary | ICD-10-CM | POA: Diagnosis not present

## 2015-01-15 DIAGNOSIS — A419 Sepsis, unspecified organism: Principal | ICD-10-CM

## 2015-01-15 DIAGNOSIS — M1990 Unspecified osteoarthritis, unspecified site: Secondary | ICD-10-CM | POA: Diagnosis present

## 2015-01-15 DIAGNOSIS — N390 Urinary tract infection, site not specified: Secondary | ICD-10-CM | POA: Diagnosis present

## 2015-01-15 DIAGNOSIS — F4323 Adjustment disorder with mixed anxiety and depressed mood: Secondary | ICD-10-CM | POA: Diagnosis present

## 2015-01-15 DIAGNOSIS — C7801 Secondary malignant neoplasm of right lung: Secondary | ICD-10-CM | POA: Diagnosis present

## 2015-01-15 DIAGNOSIS — C679 Malignant neoplasm of bladder, unspecified: Secondary | ICD-10-CM | POA: Diagnosis not present

## 2015-01-15 DIAGNOSIS — D638 Anemia in other chronic diseases classified elsewhere: Secondary | ICD-10-CM | POA: Diagnosis present

## 2015-01-15 DIAGNOSIS — C7902 Secondary malignant neoplasm of left kidney and renal pelvis: Secondary | ICD-10-CM | POA: Diagnosis present

## 2015-01-15 DIAGNOSIS — E876 Hypokalemia: Secondary | ICD-10-CM

## 2015-01-15 DIAGNOSIS — C7971 Secondary malignant neoplasm of right adrenal gland: Secondary | ICD-10-CM | POA: Diagnosis present

## 2015-01-15 DIAGNOSIS — C787 Secondary malignant neoplasm of liver and intrahepatic bile duct: Secondary | ICD-10-CM | POA: Diagnosis present

## 2015-01-15 DIAGNOSIS — Z8249 Family history of ischemic heart disease and other diseases of the circulatory system: Secondary | ICD-10-CM | POA: Diagnosis not present

## 2015-01-15 DIAGNOSIS — D696 Thrombocytopenia, unspecified: Secondary | ICD-10-CM | POA: Diagnosis present

## 2015-01-15 DIAGNOSIS — Z8041 Family history of malignant neoplasm of ovary: Secondary | ICD-10-CM | POA: Diagnosis not present

## 2015-01-15 DIAGNOSIS — Z515 Encounter for palliative care: Secondary | ICD-10-CM | POA: Diagnosis not present

## 2015-01-15 DIAGNOSIS — R531 Weakness: Secondary | ICD-10-CM | POA: Diagnosis present

## 2015-01-15 DIAGNOSIS — E860 Dehydration: Secondary | ICD-10-CM | POA: Diagnosis present

## 2015-01-15 DIAGNOSIS — G8929 Other chronic pain: Secondary | ICD-10-CM | POA: Diagnosis present

## 2015-01-15 DIAGNOSIS — N179 Acute kidney failure, unspecified: Secondary | ICD-10-CM | POA: Diagnosis present

## 2015-01-15 DIAGNOSIS — I10 Essential (primary) hypertension: Secondary | ICD-10-CM | POA: Diagnosis present

## 2015-01-15 DIAGNOSIS — C7802 Secondary malignant neoplasm of left lung: Secondary | ICD-10-CM | POA: Diagnosis present

## 2015-01-15 DIAGNOSIS — Z66 Do not resuscitate: Secondary | ICD-10-CM | POA: Diagnosis present

## 2015-01-15 DIAGNOSIS — C7901 Secondary malignant neoplasm of right kidney and renal pelvis: Secondary | ICD-10-CM | POA: Diagnosis present

## 2015-01-15 DIAGNOSIS — R319 Hematuria, unspecified: Secondary | ICD-10-CM | POA: Diagnosis present

## 2015-01-15 DIAGNOSIS — E780 Pure hypercholesterolemia: Secondary | ICD-10-CM | POA: Diagnosis present

## 2015-01-15 HISTORY — DX: Acute kidney failure, unspecified: N17.9

## 2015-01-15 LAB — COMPREHENSIVE METABOLIC PANEL
ALT: 98 U/L — ABNORMAL HIGH (ref 0–35)
AST: 151 U/L — ABNORMAL HIGH (ref 0–37)
Albumin: 1.9 g/dL — ABNORMAL LOW (ref 3.5–5.2)
Alkaline Phosphatase: 135 U/L — ABNORMAL HIGH (ref 39–117)
Anion gap: 8 (ref 5–15)
BUN: 50 mg/dL — ABNORMAL HIGH (ref 6–23)
CO2: 17 mmol/L — AB (ref 19–32)
CREATININE: 1.53 mg/dL — AB (ref 0.50–1.10)
Calcium: 7.4 mg/dL — ABNORMAL LOW (ref 8.4–10.5)
Chloride: 111 mmol/L (ref 96–112)
GFR, EST AFRICAN AMERICAN: 40 mL/min — AB (ref >90)
GFR, EST NON AFRICAN AMERICAN: 35 mL/min — AB (ref >90)
Glucose, Bld: 105 mg/dL — ABNORMAL HIGH (ref 70–99)
Potassium: 3.5 mmol/L (ref 3.5–5.1)
Sodium: 136 mmol/L (ref 135–145)
Total Bilirubin: 1 mg/dL (ref 0.3–1.2)
Total Protein: 4.9 g/dL — ABNORMAL LOW (ref 6.0–8.3)

## 2015-01-15 LAB — CBC
HCT: 21.1 % — ABNORMAL LOW (ref 36.0–46.0)
Hemoglobin: 7.2 g/dL — ABNORMAL LOW (ref 12.0–15.0)
MCH: 27.3 pg (ref 26.0–34.0)
MCHC: 34.1 g/dL (ref 30.0–36.0)
MCV: 79.9 fL (ref 78.0–100.0)
PLATELETS: 64 10*3/uL — AB (ref 150–400)
RBC: 2.64 MIL/uL — AB (ref 3.87–5.11)
RDW: 15.9 % — ABNORMAL HIGH (ref 11.5–15.5)
WBC: 14.9 10*3/uL — AB (ref 4.0–10.5)

## 2015-01-15 LAB — LACTIC ACID, PLASMA: Lactic Acid, Venous: 1.8 mmol/L (ref 0.5–2.0)

## 2015-01-15 LAB — CREATININE, URINE, RANDOM: CREATININE, URINE: 301.4 mg/dL

## 2015-01-15 LAB — CLOSTRIDIUM DIFFICILE BY PCR: Toxigenic C. Difficile by PCR: POSITIVE — AB

## 2015-01-15 LAB — PROCALCITONIN: PROCALCITONIN: 1.02 ng/mL

## 2015-01-15 LAB — GLUCOSE, CAPILLARY: Glucose-Capillary: 106 mg/dL — ABNORMAL HIGH (ref 70–99)

## 2015-01-15 LAB — SODIUM, URINE, RANDOM: Sodium, Ur: 40 mmol/L

## 2015-01-15 MED ORDER — MAGNESIUM HYDROXIDE 400 MG/5ML PO SUSP: 30.0000 mL | Freq: Every day | ORAL | Status: DC | PRN

## 2015-01-15 MED ORDER — SODIUM CHLORIDE 0.9 % IV BOLUS (SEPSIS)
1000.0000 mL | Freq: Once | INTRAVENOUS | Status: AC
  Administered 2015-01-15: 1000 mL via INTRAVENOUS

## 2015-01-15 MED ORDER — VANCOMYCIN 50 MG/ML ORAL SOLUTION
125.0000 mg | Freq: Four times a day (QID) | ORAL | Status: DC
  Administered 2015-01-15 – 2015-01-16 (×6): 125 mg via ORAL
  Filled 2015-01-15 (×11): qty 2.5

## 2015-01-15 MED ORDER — DEXTROSE 5 % IV SOLN
1.0000 g | Freq: Once | INTRAVENOUS | Status: AC
  Administered 2015-01-15: 1 g via INTRAVENOUS
  Filled 2015-01-15: qty 10

## 2015-01-15 MED ORDER — SODIUM CHLORIDE 0.9 % IV SOLN
INTRAVENOUS | Status: DC
  Administered 2015-01-15 – 2015-01-16 (×2): via INTRAVENOUS
  Filled 2015-01-15 (×5): qty 1000

## 2015-01-15 MED ORDER — HEPARIN SODIUM (PORCINE) 5000 UNIT/ML IJ SOLN
5000.0000 [IU] | Freq: Three times a day (TID) | INTRAMUSCULAR | Status: DC
  Filled 2015-01-15 (×4): qty 1

## 2015-01-15 MED ORDER — SODIUM CHLORIDE 0.9 % IV SOLN
INTRAVENOUS | Status: DC
  Administered 2015-01-15: 1000 mL via INTRAVENOUS

## 2015-01-15 MED ORDER — MORPHINE SULFATE 2 MG/ML IJ SOLN
2.0000 mg | Freq: Once | INTRAMUSCULAR | Status: AC
  Administered 2015-01-15: 2 mg via INTRAVENOUS
  Filled 2015-01-15: qty 1

## 2015-01-15 MED ORDER — MORPHINE SULFATE 2 MG/ML IJ SOLN
1.0000 mg | INTRAMUSCULAR | Status: DC | PRN
  Administered 2015-01-15 – 2015-01-16 (×4): 1 mg via INTRAVENOUS
  Filled 2015-01-15 (×5): qty 1

## 2015-01-15 MED ORDER — POTASSIUM CHLORIDE CRYS ER 20 MEQ PO TBCR
40.0000 meq | EXTENDED_RELEASE_TABLET | Freq: Once | ORAL | Status: DC
  Filled 2015-01-15: qty 2

## 2015-01-15 MED ORDER — SODIUM CHLORIDE 0.9 % IJ SOLN
3.0000 mL | Freq: Two times a day (BID) | INTRAMUSCULAR | Status: DC
  Administered 2015-01-15: 3 mL via INTRAVENOUS

## 2015-01-15 MED ORDER — OXYCODONE-ACETAMINOPHEN 5-325 MG PO TABS
2.0000 | ORAL_TABLET | Freq: Once | ORAL | Status: DC
  Filled 2015-01-15: qty 2

## 2015-01-15 MED ORDER — ONDANSETRON HCL 4 MG/2ML IJ SOLN
4.0000 mg | Freq: Three times a day (TID) | INTRAMUSCULAR | Status: DC | PRN
Start: 2015-01-15 — End: 2015-01-17

## 2015-01-15 MED ORDER — BISACODYL 10 MG RE SUPP: 10.0000 mg | Freq: Every day | RECTAL | Status: DC | PRN

## 2015-01-15 MED ORDER — AMLODIPINE BESYLATE 5 MG PO TABS
5.0000 mg | ORAL_TABLET | Freq: Every day | ORAL | Status: DC
  Administered 2015-01-15: 5 mg via ORAL
  Filled 2015-01-15 (×2): qty 1

## 2015-01-15 MED ORDER — POTASSIUM CHLORIDE CRYS ER 20 MEQ PO TBCR
40.0000 meq | EXTENDED_RELEASE_TABLET | Freq: Once | ORAL | Status: AC
  Administered 2015-01-15: 40 meq via ORAL
  Filled 2015-01-15: qty 2

## 2015-01-15 MED ORDER — VANCOMYCIN 50 MG/ML ORAL SOLUTION: 125.0000 mg | Freq: Four times a day (QID) | ORAL | Status: DC

## 2015-01-15 MED ORDER — CEFTRIAXONE SODIUM IN DEXTROSE 20 MG/ML IV SOLN
1.0000 g | INTRAVENOUS | Status: DC
  Administered 2015-01-15: 1 g via INTRAVENOUS
  Filled 2015-01-15: qty 50

## 2015-01-15 MED ORDER — SODIUM CHLORIDE 0.9 % IV BOLUS (SEPSIS): 1000.0000 mL | Freq: Once | INTRAVENOUS | Status: DC

## 2015-01-15 NOTE — Progress Notes (Signed)
Clinical Social Work Department BRIEF PSYCHOSOCIAL ASSESSMENT 01/15/2015  Patient:  Taylor Foley, Taylor Foley     Account Number:  [id number]     Admit date:  01/26/2015  Clinical Social Worker:  Lacie Scotts  Date/Time:  01/15/2015 02:15 PM  Referred by:  Physician  Date Referred:  01/19/2015 Referred for  SNF Placement   Other Referral:   Interview type:  Other - See comment Other interview type:    PSYCHOSOCIAL DATA Living Status:  ALONE Admitted from facility:   Level of care:   Primary support name:  Elta Guadeloupe Primary support relationship to patient:  CHILD, ADULT Degree of support available:   limited support    CURRENT CONCERNS Current Concerns  Post-Acute Placement   Other Concerns:    SOCIAL WORK ASSESSMENT / PLAN Pt is a 64 yr old female living alone prior to hospitalization. Pt seen while in ED by [facility] / CSW. Chart reviewed and [place] contacted. Pt had been placed at SNF from [facility] on 12/25/14 as a " difficult to place" pt. Pt has no insurance at this time.  According to SNF pt was d/c home on 2/4 due to the lack of having any skilled needs which required SNF placement. SNF described pt as ambulatory, with some confusion.  ED RNCM notes pt's son Elta Guadeloupe, was contacted last night, and reported pt needs to be in a facility. CSW has left son a message to contact CSW for assistance with d/c planning. At this time nursing reports that pt is refusing all care.   Assessment/plan status:  Psychosocial Support/Ongoing Assessment of Needs Other assessment/ plan:   Information/referral to community resources:   Info to be provided once son contacts csw.    PATIENT'S/FAMILY'S RESPONSE TO PLAN OF CARE: " I didn't want to leave that facility. They kicked me out for not paying rent."  Pt reports she is uncomfortable. Nurse at bed side to assist. Pt reports that she would like to go some where to stay but is concerned about payment. CSW offered assistance resources. Pt is  willing to accept assistance, at this point, with d/c planning. Pt's d/c needs are unclear at this time. PT eval is pending. Palliative Care Team consult is also pending. CSW will call son in the am if call is not returned this afternoon to assist with d/c planning.    [name] LCSW [phone]

## 2015-01-15 NOTE — Progress Notes (Signed)
Dr Grandville Silos made aware of the above information and that patient had positive C Diff result.  MD to place orders.  Patient already on enteric precautions and red cap barrier placed over Purell  Hand santitizer per protocol

## 2015-01-15 NOTE — Progress Notes (Signed)
Patient refused lab draws this am along with vital signs.  Explained to patient importance of labs.  Patient states it is her right to refuse labs

## 2015-01-15 NOTE — Progress Notes (Signed)
I have seen and assessed patient and agree with Dr.Niu's assessment and plan. Urine cultures pending. Leukocytosis is trending down. C. difficile PCR was positive and patient with multiple loose stools. Patient has been started on oral vancomycin. Patient also noted to be in acute renal failure. Continue hydration with IV fluids. Check a renal ultrasound. Patient does have a history of metastatic stage IV bladder cancer. Palliative care consult for goals of care.

## 2015-01-15 NOTE — Progress Notes (Signed)
Patient refuses all medications including pain although she complains of pain.  She also refuses to get out of bed to use restroom and instructs staff that she does not want to be cleaned when lying in stool.  Explained to patient she can refuse medications but we have to continue to clean her when she stools.

## 2015-01-15 NOTE — H&P (Signed)
[facility] History and Physical  Taylor Foley PNT:[id number] DOB: [birth year] DOA: 01/15/2015  Referring physician: ED physician PCP: No PCP Per Patient  Specialists:   Chief Complaint: Generalized weakness, dizziness, nausea, abdominal pain and burning on urination  HPI: Taylor Foley is a 64 y.o. female with past medical history of hypertension, arthritis, and bladder cancer-stage IV, who presents with generalized weakness, dizziness, nausea, abdominal pain and burning on urination.  Patient reports that in the past several days, she has a worsening weakness. She also has dizziness, nausea, abdominal pain, but no diarrhea or vomiting. She also has burning on urination, but no dysuria. Of note, patient has stage IV bladder cancer, but she seems to be denial to this diagnoses, saying "I don't know I have cancer". Patient denies cough, chest pain, SOB, skin rashes, joint pain or leg swelling. No unilateral weakness, numbness or tingling sensations. No vision change or hearing loss.;  In ED, patient was found to have leukocytosis with WBC 20, positive urinalysis for UTI, hypokalemia, AKI. CTA is negative for PE. CT abdomen/pelbvis is negative for acute abnormalities, but showed metastasized cancer. chest x-rays negative for acute abnormalities. Slightly tachycardic. Patient is admitted to inpatient for further evaluation and treatment.  Review of Systems: As presented in the history of presenting illness, rest negative.  Where does patient live?  At home Can patient participate in ADLs? None  Allergy: No Known Allergies  Past Medical History  Diagnosis Date  . Hypertension   . Hypercholesteremia   . Syncope and collapse 11/2011; 03/14/2013  . Shortness of breath     "sometimes lying down" (03/14/2013)  . Blood in the urine     "here lately; q time I go to pee" (03/14/2013)  . Arthritis     "all over" (03/24/2013)  . Bladder tumor 12-2014  . Stage IV bladder cancer  01/08/2015  . AKI (acute kidney injury) 01/15/2015    Past Surgical History  Procedure Laterality Date  . Orif wrist fracture  12/04/2011    Procedure: OPEN REDUCTION INTERNAL FIXATION (ORIF) WRIST FRACTURE;  Surgeon: Marybelle Killings;  Location: [facility];  Service: Orthopedics;  Laterality: Left;    Social History:  reports that she has never smoked. She has never used smokeless tobacco. She reports that she does not drink alcohol or use illicit drugs.  Family History:  Family History  Problem Relation Age of Onset  . Congestive Heart Failure Father   . Hypertension Father   . Hyperlipidemia Father   . Diabetes Other   . Heart disease Other   . Prostate cancer Father   . Ovarian cancer Mother   . Hypertension Mother      Prior to Admission medications   Medication Sig Start Date End Date Taking? Authorizing Provider  bisacodyl (DULCOLAX) 10 MG suppository Place 10 mg rectally daily as needed for moderate constipation.    Historical Provider, MD  ibuprofen (ADVIL,MOTRIN) 200 MG tablet Take 600 mg by mouth every 8 (eight) hours as needed for moderate pain.    Historical Provider, MD  ibuprofen (ADVIL,MOTRIN) 600 MG tablet Take 1 tablet (600 mg total) by mouth every 8 (eight) hours as needed. Patient taking differently: Take 600 mg by mouth every 8 (eight) hours as needed. For pain. 12/31/14   Thurnell Lose, MD  magnesium hydroxide (MILK OF MAGNESIA) 400 MG/5ML suspension Take 30 mLs by mouth daily as needed for mild constipation.    Historical Provider, MD  methylPREDNISolone (MEDROL DOSEPAK) 4 MG tablet follow  package directions Patient not taking: Reported on 01/16/2015 12/31/14   Thurnell Lose, MD  Sodium Phosphates (RA SALINE ENEMA RE) Place 1 each rectally daily as needed (constipation).    Historical Provider, MD    Physical Exam: Filed Vitals:   01/15/15 0100 01/15/15 0201 01/15/15 0447 01/15/15 0500  BP: 175/85 166/105 131/95   Pulse:  104 102   Temp:  98.4 F (36.9 C) 98.8  F (37.1 C)   TempSrc:   Oral   Resp:   18   Height:    5\' 6"  (1.676 m)  Weight:    73.5 kg (162 lb 0.6 oz)  SpO2:   98%    General: Not in acute distress HEENT:       Eyes: PERRL, EOMI, no scleral icterus       ENT: No discharge from the ears and nose, no pharynx injection, no tonsillar enlargement.        Neck: No JVD, no bruit, no mass felt. Cardiac: S1/S2, RRR, No murmurs, No gallops or rubs Pulm: Good air movement bilaterally. Clear to auscultation bilaterally. No rales, wheezing, rhonchi or rubs. Abd: Soft, nondistended, diffused tenderness, no rebound pain, BS present Ext: No edema bilaterally. 2+DP/PT pulse bilaterally Musculoskeletal: No joint deformities, erythema, or stiffness, ROM full Skin: No rashes.  Neuro: Alert and oriented X3, cranial nerves II-XII grossly intact, muscle strength 5/5 in all extremeties, sensation to light touch intact.  Psych: Patient is not psychotic, no suicidal or hemocidal ideation.  Labs on Admission:  Basic Metabolic Panel:  Recent Labs Lab 01/22/2015 1839  NA 135  K 3.4*  CL 109  CO2 17*  GLUCOSE 112*  BUN 56*  CREATININE 1.58*  CALCIUM 7.5*   Liver Function Tests:  Recent Labs Lab 01/26/2015 1839  AST 143*  ALT 98*  ALKPHOS 145*  BILITOT 1.2  PROT 5.0*  ALBUMIN 2.0*   No results for input(s): LIPASE, AMYLASE in the last 168 hours. No results for input(s): AMMONIA in the last 168 hours. CBC:  Recent Labs Lab 01/22/2015 1727  WBC 20.0*  HGB 8.7*  HCT 26.0*  MCV 80.5  PLT 111*   Cardiac Enzymes: No results for input(s): CKTOTAL, CKMB, CKMBINDEX, TROPONINI in the last 168 hours.  BNP (last 3 results) No results for input(s): BNP in the last 8760 hours.  ProBNP (last 3 results) No results for input(s): PROBNP in the last 8760 hours.  CBG:  Recent Labs Lab 01/15/15 0722  GLUCAP 106*    Radiological Exams on Admission: Ct Abdomen Pelvis Wo Contrast  02/01/2015   CLINICAL DATA:  Dizziness for 4 days. Cancer  in stomach, liver, and lung. Confusion. New acute abdominal pain for 1 day.  EXAM: CT ABDOMEN AND PELVIS WITHOUT CONTRAST  TECHNIQUE: Multidetector CT imaging of the abdomen and pelvis was performed following the standard protocol without IV contrast.  COMPARISON:  12/26/2014  FINDINGS: Lung bases demonstrate multiple pulmonary nodules compatible with metastatic disease. Cardiac enlargement with small pericardial effusion.  There is residual contrast material in the kidneys and renal collecting systems from previous CT angio of the chest from earlier today.  Normal multiple diffuse low-attenuation lesions demonstrated throughout the liver, better seen on previous contrast enhanced study. This is consistent with diffuse hepatic metastasis. Multiple solid masses demonstrated in both kidneys consistent with diffuse metastatic disease in the kidneys. No hydronephrosis. Spleen and pancreas are unremarkable. Right adrenal gland nodule measuring 2.4 cm diameter, likely metastatic. Gallbladder, abdominal aorta, and inferior vena cava  are unremarkable. Scattered retroperitoneal and mesenteric lymph nodes are not pathologically enlarged. No free fluid or free air in the abdomen. Stomach, small bowel, and colon are decompressed. No evidence of obstruction but lack of distention limits evaluation for wall thickening.  Pelvis: Heterogeneous thickening of the bladder wall probably represents metastatic disease although primary or inflammatory process could also have this appearance. Uterus and ovaries are not enlarged. Small amount of free fluid in the pelvis. Appendix is not identified. No significant pelvic lymphadenopathy. Degenerative changes in the spine and hips. No destructive bone lesions are appreciated.  IMPRESSION: Diffuse metastatic disease throughout the liver and kidneys as well as right adrenal gland. There is progression since previous study. Small amount of free fluid in the pelvis is likely related to metastatic  disease. Asymmetric heterogeneous thickening of the bladder wall may represent metastatic disease although primary lesion or inflammatory process could also have this appearance. No evidence of bowel obstruction.   Electronically Signed   By: Lucienne Capers M.D.   On: 01/29/2015 23:58   Dg Chest 2 View  01/26/2015   CLINICAL DATA:  Dizziness for 4 days. Left-sided chest pain. History of hypertension. Cancer in the stomach, liver, and lung.  EXAM: CHEST  2 VIEW  COMPARISON:  12/25/2014  FINDINGS: Normal heart size and pulmonary vascularity. Vague diffuse nodular parenchymal pattern to the lungs again demonstrated. This corresponds to known pulmonary metastasis. No focal consolidation or atelectasis. No blunting of costophrenic angles. No pneumothorax. Degenerative changes in the spine.  IMPRESSION: Vague nodular parenchymal pattern consistent with known metastatic disease. No evidence of acute consolidation. Normal heart size.   Electronically Signed   By: Lucienne Capers M.D.   On: 01/23/2015 18:39   Ct Angio Chest Pe W/cm &/or Wo Cm  01/06/2015   CLINICAL DATA:  Dizziness. Recent diagnosis of stage IV bladder carcinoma with metastatic disease involving the lungs and liver.  EXAM: CT ANGIOGRAPHY CHEST WITH CONTRAST  TECHNIQUE: Multidetector CT imaging of the chest was performed using the standard protocol during bolus administration of intravenous contrast. Multiplanar CT image reconstructions and MIPs were obtained to evaluate the vascular anatomy.  CONTRAST:  37mL OMNIPAQUE IOHEXOL 350 MG/ML SOLN  COMPARISON:  12/25/2014 chest CT and 12/26/2014 abdominal and pelvic CT  FINDINGS: There is progression of metastatic disease throughout the chest since the prior study. Lymphadenopathy in the right axilla, bilateral hilar regions and subcarinal mediastinum has progressed since the prior study. A right hilar lymph node mass shows substantial enlargement and measures 2.3 x 2.9 cm. This previously measured 1.3 x  1.8 cm. Subcarinal lymphadenopathy measures 1.3 cm in short axis. Multiple left hilar lymph nodes appear larger compared to the prior study.  Innumerable bilateral pulmonary metastatic nodules are again identified. All of the metastatic nodules have increased slightly in size since the prior study and multiple nodules demonstrate cavitation. Large nodule in the right upper lobe measures approximately 1.4 cm in greatest diameter. Largest nodule in the lingula of measures approximately 1.5 cm in greatest diameter.  No evidence of acute pulmonary embolism. The thoracic aorta is normal in caliber. No pleural or pericardial fluid is seen. The heart size is normal. No visible bony metastases or fractures.  The visualized upper abdomen shows significant progression of metastatic disease in the liver since a prior CT of the abdomen on 12/26/2014. Diffuse lesions throughout both lobes of the liver show significant enlargement. There now is confluent metastatic disease in the posterior right lobe of the liver over an  area of measuring roughly 7 x 9 cm. Near the dome of the liver anteriorly, a dominant index lesion has increased in diameter from approximately 2.8 cm to 3.9 cm. No visible ascites in the upper abdomen.  Review of the MIP images confirms the above findings.  IMPRESSION: 1. No evidence of pulmonary embolism. 2. Significant progression of metastatic disease in the chest and liver. In the chest, metastatic lymphadenopathy has significantly increased in size and innumerable bilateral pulmonary metastatic nodules have also all increased in size. Many cavitary nodules are again identified. Diffuse metastatic disease throughout the visualized liver has worsened substantially since the prior CT studies.   Electronically Signed   By: Aletta Edouard M.D.   On: 01/08/2015 20:36    EKG: Independently reviewed.   Assessment/Plan Principal Problem:   UTI (lower urinary tract infection) Active Problems:   HTN  (hypertension)   Adjustment disorder with mixed anxiety and depressed mood   Palliative care encounter   Stage IV bladder cancer   Sepsis   Hypokalemia   AKI (acute kidney injury)  UTI and sepsis: Patient has burning on urination. Her urinalysis is positive for small amount of leukocytes. She also has leukocytosis. Patient is mildly septic on admission with leukocytosis and tachycardia.  -will admit to med-surg bed -start IV rocephin  -follow blood and urine culture -IVF: Received 2 L of normal saline, followed up by 100 mL per hour -Trend lactic acid level -Consult to social work for possible SNF placement  AKI: Creatinine is up from 0.95-1.58, BUN 56, possible prerenal failure. -check FeNa -IVF as above -Hold ibuprofen  Hypokalemia: -Repleted  Hypertension: Patient is not on medications at home. -Start low-dose of amlodipine 5 mg daily  Stage IV bladder cancer: patient is followed up by Dr. Alinda Money. By epic chart review, she is scheduled for transurethral resection of bladder tumor on 01/26/15. -follow up with urologist.   DVT ppx: SQ Heparin      Code Status: Full code Family Communication: None at bed side.      Disposition Plan: Admit to inpatient   Date of Service 01/15/2015    Ivor Costa [facility] Pager [phone]  If 7PM-7AM, please contact night-coverage [email or web address] Password [facility] 01/15/2015, 7:28 AM

## 2015-01-15 NOTE — Progress Notes (Signed)
PT Cancellation Note  Patient Details Name: Taylor Foley MRN: [id number] DOB: May 12, 1951   Cancelled Treatment:    Reason Eval/Treat Not Completed: Other (comment) (current orders for bedrest and strict bedrest. will check back in PM for clearance to mobilize. )   Claretha Cooper 01/15/2015, 10:04 AM Tresa Endo PT [phone]

## 2015-01-15 NOTE — Telephone Encounter (Signed)
Pt 's son confirmed appt on 01/20/15 at 1pm w/[facility] Dx: Bladder cancer Referring Dr. Sheppard Coil

## 2015-01-16 DIAGNOSIS — Z515 Encounter for palliative care: Secondary | ICD-10-CM

## 2015-01-16 DIAGNOSIS — D649 Anemia, unspecified: Secondary | ICD-10-CM

## 2015-01-16 LAB — COMPREHENSIVE METABOLIC PANEL
ALK PHOS: 135 U/L — AB (ref 39–117)
ALT: 95 U/L — ABNORMAL HIGH (ref 0–35)
AST: 154 U/L — AB (ref 0–37)
Albumin: 1.7 g/dL — ABNORMAL LOW (ref 3.5–5.2)
Anion gap: 7 (ref 5–15)
BILIRUBIN TOTAL: 1 mg/dL (ref 0.3–1.2)
BUN: 50 mg/dL — ABNORMAL HIGH (ref 6–23)
CO2: 15 mmol/L — ABNORMAL LOW (ref 19–32)
CREATININE: 1.67 mg/dL — AB (ref 0.50–1.10)
Calcium: 7.3 mg/dL — ABNORMAL LOW (ref 8.4–10.5)
Chloride: 113 mmol/L — ABNORMAL HIGH (ref 96–112)
GFR calc Af Amer: 36 mL/min — ABNORMAL LOW (ref >90)
GFR, EST NON AFRICAN AMERICAN: 31 mL/min — AB (ref >90)
Glucose, Bld: 113 mg/dL — ABNORMAL HIGH (ref 70–99)
Potassium: 4.4 mmol/L (ref 3.5–5.1)
SODIUM: 135 mmol/L (ref 135–145)
Total Protein: 4.5 g/dL — ABNORMAL LOW (ref 6.0–8.3)

## 2015-01-16 LAB — CBC WITH DIFFERENTIAL/PLATELET
BASOS ABS: 0 10*3/uL (ref 0.0–0.1)
Basophils Relative: 0 % (ref 0–1)
EOS ABS: 0 10*3/uL (ref 0.0–0.7)
EOS PCT: 0 % (ref 0–5)
HCT: 18.4 % — ABNORMAL LOW (ref 36.0–46.0)
HEMOGLOBIN: 6 g/dL — AB (ref 12.0–15.0)
LYMPHS PCT: 9 % — AB (ref 12–46)
Lymphs Abs: 2 10*3/uL (ref 0.7–4.0)
MCH: 26.9 pg (ref 26.0–34.0)
MCHC: 32.6 g/dL (ref 30.0–36.0)
MCV: 82.5 fL (ref 78.0–100.0)
MONOS PCT: 7 % (ref 3–12)
Monocytes Absolute: 1.5 10*3/uL — ABNORMAL HIGH (ref 0.1–1.0)
Neutro Abs: 18.3 10*3/uL — ABNORMAL HIGH (ref 1.7–7.7)
Neutrophils Relative %: 84 % — ABNORMAL HIGH (ref 43–77)
Platelets: 62 10*3/uL — ABNORMAL LOW (ref 150–400)
RBC: 2.23 MIL/uL — AB (ref 3.87–5.11)
RDW: 17.1 % — AB (ref 11.5–15.5)
WBC: 21.8 10*3/uL — ABNORMAL HIGH (ref 4.0–10.5)
nRBC: 6 /100 WBC — ABNORMAL HIGH

## 2015-01-16 LAB — URINE CULTURE
CULTURE: NO GROWTH
Colony Count: NO GROWTH

## 2015-01-16 LAB — MAGNESIUM: Magnesium: 1.9 mg/dL (ref 1.5–2.5)

## 2015-01-16 LAB — GLUCOSE, CAPILLARY: Glucose-Capillary: 100 mg/dL — ABNORMAL HIGH (ref 70–99)

## 2015-01-16 MED ORDER — POLYVINYL ALCOHOL 1.4 % OP SOLN
1.0000 [drp] | OPHTHALMIC | Status: DC | PRN
  Filled 2015-01-16: qty 15

## 2015-01-16 MED ORDER — SODIUM CHLORIDE 0.9 % IV SOLN
1.0000 mg/h | INTRAVENOUS | Status: DC
  Administered 2015-01-16: 1 mg/h via INTRAVENOUS
  Filled 2015-01-16: qty 10

## 2015-01-16 MED ORDER — LORAZEPAM 2 MG/ML IJ SOLN
0.5000 mg | Freq: Three times a day (TID) | INTRAMUSCULAR | Status: DC | PRN
  Administered 2015-01-16: 0.5 mg via INTRAVENOUS

## 2015-01-16 MED ORDER — LORAZEPAM 2 MG/ML IJ SOLN
INTRAMUSCULAR | Status: AC
  Administered 2015-01-16: 0.5 mg via INTRAVENOUS
  Filled 2015-01-16: qty 1

## 2015-01-16 MED ORDER — SODIUM CHLORIDE 0.9 % IV SOLN: Freq: Once | INTRAVENOUS | Status: DC

## 2015-01-16 MED ORDER — SCOPOLAMINE 1 MG/3DAYS TD PT72
1.0000 | MEDICATED_PATCH | TRANSDERMAL | Status: DC
  Administered 2015-01-16: 1.5 mg via TRANSDERMAL
  Filled 2015-01-16: qty 1

## 2015-01-16 MED ORDER — SODIUM CHLORIDE 0.9 % IV SOLN
Freq: Once | INTRAVENOUS | Status: AC
  Administered 2015-01-16: 09:00:00 via INTRAVENOUS

## 2015-01-16 MED ORDER — FUROSEMIDE 10 MG/ML IJ SOLN
20.0000 mg | Freq: Once | INTRAMUSCULAR | Status: DC
  Filled 2015-01-16: qty 2

## 2015-01-16 MED ORDER — SODIUM BICARBONATE 8.4 % IV SOLN
INTRAVENOUS | Status: DC
  Filled 2015-01-16 (×2): qty 1000

## 2015-01-16 MED ORDER — MORPHINE SULFATE 2 MG/ML IJ SOLN
2.0000 mg | INTRAMUSCULAR | Status: DC | PRN
  Filled 2015-01-16: qty 1

## 2015-01-16 MED ORDER — MORPHINE BOLUS VIA INFUSION
2.0000 mg | INTRAVENOUS | Status: DC | PRN
  Administered 2015-01-16 (×2): 2 mg via INTRAVENOUS
  Filled 2015-01-16 (×3): qty 2

## 2015-01-16 MED ORDER — HALOPERIDOL LACTATE 5 MG/ML IJ SOLN: 1.0000 mg | Freq: Once | INTRAMUSCULAR | Status: AC

## 2015-01-16 MED ORDER — ACETAMINOPHEN 325 MG PO TABS: 650.0000 mg | ORAL_TABLET | Freq: Once | ORAL | Status: DC

## 2015-01-16 MED ORDER — DIPHENHYDRAMINE HCL 25 MG PO CAPS: 25.0000 mg | ORAL_CAPSULE | Freq: Once | ORAL | Status: DC

## 2015-01-16 MED ORDER — HALOPERIDOL LACTATE 5 MG/ML IJ SOLN
1.0000 mg | Freq: Four times a day (QID) | INTRAMUSCULAR | Status: DC
  Administered 2015-01-16: 1 mg via INTRAVENOUS
  Filled 2015-01-16 (×4): qty 0.2
  Filled 2015-01-16: qty 1

## 2015-01-16 MED ORDER — HALOPERIDOL LACTATE 5 MG/ML IJ SOLN
INTRAMUSCULAR | Status: AC
  Administered 2015-01-16: 1 mg
  Filled 2015-01-16: qty 1

## 2015-01-16 NOTE — Progress Notes (Addendum)
CSW assisting with d/c planning. PN reviewed. Palliative Care Team has recommended Hospice Home placement. Notes indicate that son is interested in [facility]. CSW will follow to assist with hospice placement.  [name] LCSW [phone]  [phone] referral has been made to [facility]. A decision is pending.  [name] LCSW

## 2015-01-16 NOTE — Progress Notes (Deleted)
Clinical Social Work Department CLINICAL SOCIAL WORK PLACEMENT NOTE 01/16/2015  Patient:  Taylor Foley  Account Number:  [id number] Admit date:  01/22/2015  Clinical Social Worker:  [name], LCSW  Date/time:  01/16/2015 12:44 PM  Clinical Social Work is seeking post-discharge placement for this patient at the following level of care:   SKILLED NURSING   (*CSW will update this form in Epic as items are completed)     Patient/family provided with [facility] Department of Clinical Social Work's list of facilities offering this level of care within the geographic area requested by the patient (or if unable, by the patient's family).  01/16/2015  Patient/family informed of their freedom to choose among providers that offer the needed level of care, that participate in Medicare, Medicaid or managed care program needed by the patient, have an available bed and are willing to accept the patient.  01/16/2015  Patient/family informed of [facility]' ownership interest in [facility], as well as of the fact that they are under no obligation to receive care at this facility.  PASARR submitted to EDS on 01/16/2015 PASARR number received on 01/16/2015  FL2 transmitted to all facilities in geographic area requested by pt/family on  01/16/2015 FL2 transmitted to all facilities within larger geographic area on   Patient informed that his/her managed care company has contracts with or will negotiate with  certain facilities, including the following:     Patient/family informed of bed offers received:  01/16/2015 Patient chooses bed at [facility] Physician recommends and patient chooses bed at    Patient to be transferred to  on   Patient to be transferred to facility by  Patient and family notified of transfer on  Name of family member notified:    The following physician request were entered in Epic:   Additional Comments: [facility] will have a bed for pt  SAT[facility].  [name] LCSW [phone]

## 2015-01-16 NOTE — Care Management Note (Signed)
    Page 1 of 1   01/16/2015     11:57:20 AM CARE MANAGEMENT NOTE 01/16/2015  Patient:  Taylor Foley, Taylor Foley   Account Number:  [id number]  Date Initiated:  01/16/2015  Documentation initiated by:  Sunday Spillers  Subjective/Objective Assessment:   64 yo female admitted s/p fall, confusion, c.diff. PTA lived at home alone.     Action/Plan:   SNF vs ALF   Anticipated DC Date:  01/19/2015   Anticipated DC Plan:  SKILLED NURSING FACILITY  In-house referral  Clinical Social Worker      DC Planning Services  CM consult      Choice offered to / List presented to:             Status of service:  Completed, signed off Medicare Important Message given?   (If response is "NO", the following Medicare IM given date fields will be blank) Date Medicare IM given:   Medicare IM given by:   Date Additional Medicare IM given:   Additional Medicare IM given by:    Discharge Disposition:  [facility]  Per UR Regulation:  Reviewed for med. necessity/level of care/duration of stay  If discussed at [place] of Stay Meetings, dates discussed:    Comments:

## 2015-01-16 NOTE — Consult Note (Signed)
Palliative Medicine Team at [facility]  Date: 01/16/2015   Patient Name: Taylor Foley  DOB: [birth year]  MRN: [id number]  Age / Sex: 64 y.o., female   PCP: No Pcp Per Patient Referring Physician: Eugenie Filler, MD  Active Problems: Principal Problem:   Sepsis Active Problems:   HTN (hypertension)   Adjustment disorder with mixed anxiety and depressed mood   Palliative care encounter   Stage IV bladder cancer   UTI (lower urinary tract infection)   Hypokalemia   AKI (acute kidney injury)   Anemia   Thrombocytopenia   C. difficile colitis   ARF (acute renal failure)   HPI/Reason for Consultation: Baray is a 64 y.o. female with widely metastatic bladder cancer-lungs adrenals liver and probably bone marrow admitted with confusion, severe anemia, and dehydration from diarrhea and poor PO intake. She has been at [facility] since 1/25 and per son has declined significantly. She was deemed to have capacity on her prior hospitalization but clearly does not currently have capacity and she has been refusing all medications, food, procedures and attempts at treatment including outpatient evaluation and workup of her cancer. CT shows significant progression of her cancer even since her CT last month.   She is currently extremely agitated and moaning out in pain. PMT consulted for symptom management and goals of care.  Participants in Discussion: Son Sallyanne Birkhead   Advance Directive: none   Code Status Orders        Start     Ordered   01/16/15 1118  Do not attempt resuscitation (DNR)   Continuous    Question Answer Comment  In the event of cardiac or respiratory ARREST Do not call a "code blue"   In the event of cardiac or respiratory ARREST Do not perform Intubation, CPR, defibrillation or ACLS   In the event of cardiac or respiratory ARREST Use medication by any route, position, wound care, and other measures to relive pain and suffering. May use oxygen,  suction and manual treatment of airway obstruction as needed for comfort.      01/16/15 1117       I have reviewed the medical record, interviewed the patient and family, and examined the patient. The following aspects are pertinent.  Past Medical History  Diagnosis Date  . Hypertension   . Hypercholesteremia   . Syncope and collapse 11/2011; 03/14/2013  . Shortness of breath     "sometimes lying down" (03/14/2013)  . Blood in the urine     "here lately; q time I go to pee" (03/14/2013)  . Arthritis     "all over" (03/24/2013)  . Bladder tumor 12-2014  . Stage IV bladder cancer 01/08/2015  . AKI (acute kidney injury) 01/15/2015   History   Social History  . Marital Status: Single    Spouse Name: N/A  . Number of Children: N/A  . Years of Education: N/A   Social History Main Topics  . Smoking status: Never Smoker   . Smokeless tobacco: Never Used  . Alcohol Use: No  . Drug Use: No  . Sexual Activity: No   Other Topics Concern  . None   Social History Narrative   Family History  Problem Relation Age of Onset  . Congestive Heart Failure Father   . Hypertension Father   . Hyperlipidemia Father   . Diabetes Other   . Heart disease Other   . Prostate cancer Father   . Ovarian cancer Mother   . Hypertension  Mother    Scheduled Meds: . sodium chloride   Intravenous Once  . haloperidol lactate  1 mg Intravenous 4 times per day  . scopolamine  1 patch Transdermal Q72H  . sodium chloride  3 mL Intravenous Q12H  . vancomycin  125 mg Oral QID   Continuous Infusions: . morphine     PRN Meds:.morphine, ondansetron, polyvinyl alcohol No Known Allergies CBC:    Component Value Date/Time   WBC 21.8* 01/16/2015 0410   HGB 6.0* 01/16/2015 0410   HCT 18.4* 01/16/2015 0410   PLT 62* 01/16/2015 0410   MCV 82.5 01/16/2015 0410   NEUTROABS 18.3* 01/16/2015 0410   LYMPHSABS 2.0 01/16/2015 0410   MONOABS 1.5* 01/16/2015 0410   EOSABS 0.0 01/16/2015 0410   BASOSABS 0.0  01/16/2015 0410   Comprehensive Metabolic Panel:    Component Value Date/Time   NA 135 01/16/2015 0410   K 4.4 01/16/2015 0410   CL 113* 01/16/2015 0410   CO2 15* 01/16/2015 0410   BUN 50* 01/16/2015 0410   CREATININE 1.67* 01/16/2015 0410   GLUCOSE 113* 01/16/2015 0410   CALCIUM 7.3* 01/16/2015 0410   AST 154* 01/16/2015 0410   ALT 95* 01/16/2015 0410   ALKPHOS 135* 01/16/2015 0410   BILITOT 1.0 01/16/2015 0410   PROT 4.5* 01/16/2015 0410   ALBUMIN 1.7* 01/16/2015 0410    Vital Signs: BP 148/75 mmHg  Pulse 126  Temp(Src) 99 F (37.2 C) (Axillary)  Resp 18  Ht 5\' 6"  (1.676 m)  Wt 73.5 kg (162 lb 0.6 oz)  BMI 26.17 kg/m2  SpO2 97% Filed Weights   01/15/15 0500  Weight: 73.5 kg (162 lb 0.6 oz)   02/11 0701 - 02/12 0700 In: 2114.6 [I.V.:2064.6; IV Piggyback:50] Out: -   Physical Exam:  Pale, confused, tachypnea, scattered wheezing, extremity edema, not oriented to person or place, can answer some yes no questions but not reliably- she is combative when staff try to move her and they have been unable to draw blood. +1 PIV  Summary of Established Goals of Care and Medical Treatment Preferences  Primary Diagnoses  1. Metastatic Bladder Cancer 2. Severe anemia (6.0), +schisocytes, DIC of malignancy 3. C. Diff positive  Active Symptoms: 1. Terminal Agitation 2. Uncontrolled pain 3. Dyspnea 4. Weakness 5. Diarrhea  Psycho-social/Spiritual:  Spoke with Mark at length about his mom's condition- she is approaching EOL. He has seen her decline and knows this is serious- he wants her to be comfortable-he is anticipating her death. He understands that she cannot make decisions for herself at this point.  Prognosis: <2 weeks (terminal agitation, no PO intake, rapidly progressive anemia)   Palliative Performance Scale: 20%  Recommendations:  1. Code Status: DNR, confirmed  2. Scope of Treatment:  Full comfort care  No transfusion, no blood draws, stop all  medications not related to comfort  Would continue C. Diff abx as a comfort measure and infection control measure-if she cannot take oral vancomycin can switch to IV flagyl  Started morphine infusion at 1mg /hr with boluses and titration orders  Scheduled Haldol for her agitation  3. Palliative Prophylaxis:  scopalamine for secretions, abdominal cramping and dizziness, eye care, no need for laxative 4. Disposition:  She will need a hospice facility for her EOL care- Elta Guadeloupe is worried about how he will "pay for everything" - he requests [employer] [facility] and he lives in [place].    Time : 10:30- 11:40  Time Total: 70 min Greater than 50%  of this  time was spent counseling and coordinating care related to the above assessment and plan.  Signed by: Roma Schanz, DO  01/16/2015, 11:25 AM  Please contact Palliative Medicine Team phone at [phone] for questions and concerns.

## 2015-01-16 NOTE — Progress Notes (Signed)
Pt needs type and screen for critical hgb of 6.0.  Pt combative and confused, two attempts of IV blood draw, pt refuses further attempts despite IV ativan.

## 2015-01-16 NOTE — Progress Notes (Signed)
PT MOANING AND GROINING THAT SHE HURTS. I ASKED HER WHERE, SHE POINTED TO UPPER ABD.  I TOUCHED AT [place]. NOTIFIED PROVIDER ON CALL AND OBTAINED ORDER FOR MORE PAIN MEDS.

## 2015-01-16 NOTE — Progress Notes (Deleted)
Clinical Social Work Department BRIEF PSYCHOSOCIAL ASSESSMENT 01/16/2015  Patient:  Taylor Foley     Account Number:  [id number]     Admit date:  01/23/2015  Clinical Social Worker:  Taylor Foley  Date/Time:  01/16/2015 12:33 PM  Referred by:  Physician  Date Referred:  01/16/2015 Referred for  SNF Placement   Other Referral:   Interview type:  Patient Other interview type:    PSYCHOSOCIAL DATA Living Status:  ALONE Admitted from facility:   Level of care:   Primary support name:  Taylor Foley Primary support relationship to patient:  CHILD, ADULT Degree of support available:   supportive    CURRENT CONCERNS Current Concerns  Post-Acute Placement   Other Concerns:    SOCIAL WORK ASSESSMENT / PLAN Pt is an 64 yr old female living at home prior to hospitalization. Pt has an ankle fx and had surgery 2/11. Additional surgery is pending. MD / PT has recommended ST rehab following hospital d/c. CSW has met with pt / son to offer assistance with d/c planning. Pt / family are in agreement with plan for rehab and have requested [facility]. SNF has been contacted and bed offer received. CSW will continue to follow to assist with d/c planning to SNF.   Assessment/plan status:  Psychosocial Support/Ongoing Assessment of Foley Other assessment/ plan:   Information/referral to community resources:   Insurance coverage for SNF reviewed.    PATIENT'S/FAMILY'S RESPONSE TO PLAN OF CARE: Pt understands that rehab is needed following hospitalization, though she prefers to return home. " If I have to go somewhere I want to go to [facility] " Pt worries about getting along with a roommate.  Pt is anxious regarding placement but does accept reassurance from family / CSW.    Taylor Lean LCSW [phone]

## 2015-01-16 NOTE — Progress Notes (Signed)
CRITICAL VALUE ALERT  Critical value received: HGB 6.0  Date of notification:  12/16/2014  Time of notification:  9169*  Critical value read back:Yes.    Nurse who received alert:  Kathryne Hitch RN  MD notified (1st page):  0600  Time of first page:  0610  MD notified (2nd page):  Time of second page:  Responding MD:  YES  Time MD responded:  [phone]

## 2015-01-16 NOTE — Progress Notes (Signed)
PT Cancellation Note  Patient Details Name: Taylor Foley MRN: [id number] DOB: 1950-12-07   Cancelled Treatment:    Reason Eval/Treat Not Completed: Medical issues which prohibited therapy (hgb 6.0- on 2/11, RN reports pt is refusing to mobilize so PT did not attempt eval.)   Claretha Cooper 01/16/2015, 7:20 AM Tresa Endo PT [phone]

## 2015-01-16 NOTE — Progress Notes (Signed)
[facility] has a bed for pt on SAT. Harmon Pier, liaison, from [facility] will contact family to assist with arrangements. Weekend CSW will  assist with d/c planning to [facility] tomorrow.  [name] LCSW [phone]

## 2015-01-16 NOTE — Progress Notes (Signed)
Pt has critical HGB 6.0 Notified provider on call. Order to transfuse blood received.

## 2015-01-16 NOTE — Progress Notes (Signed)
OT Cancellation Note  Patient Details Name: Taylor Foley MRN: [id number] DOB: July 07, 1951   Cancelled Treatment:      Reason Eval/Treat Not Completed: Medical issues which prohibited therapy hgb 6.0- on 2/12 & pt to receive 2 Units per chart review. Will check back when medically cleared for OT Assessment.  Carlynn Herald Amy Beth Dixon, OTR/L 01/16/2015, 7:48 AM

## 2015-01-16 NOTE — Progress Notes (Signed)
[facility] PROGRESS NOTE  Taylor Foley WER:[id number] DOB: [birth year] DOA: 01/22/2015 PCP: No PCP Per Patient  Assessment/Plan: #1 sepsis Likely multifactorial secondary to acute C. difficile colitis and urinary tract infection. Patient has been pancultured and cultures are pending. Continue empiric oral vancomycin. Patient was on IV Rocephin however this has been discontinued per palliative care. Palliative care has assessed the patient and in discussions with the family for comfort approach.  #2 C. difficile colitis Continue oral vancomycin.  #3 UTI Urine cultures pending. IV antibiotics have been discontinued as patient is a focal foot approach at this time.  #4 acute renal failure Likely secondary to a prerenal azotemia. Patient on IV fluids. Follow.  #5 hypokalemia Repleted.  #6 hypertension Stable follow.  #7 anemia Likely anemia of chronic disease. Patient with no overt bleeding. Patient's hemoglobin currently at 6. Patient very combative and phlebotomy was unable to type and cross. Patient has been seen by palliative care and sour for comfort approach. Will discontinue lab draws and transfusions.  #8 metastatic stage IV bladder cancer Patient with metastases to the liver kidneys adrenal glands. Patient has been deteriorating with a poor prognosis. Patient has been seen in consultation by palliative care for goals of care and a focal foot approach. Patient has been placed on scopolamine patch as well as a morphine drip. Will transfer to hospice home when bed available.  Code Status: DO NOT RESUSCITATE Family Communication: Updated patient no family at bedside. Disposition Plan: Hospice home when bed available.   Consultants:  Palliative care: Dr. Hilma Favors 01/16/2015  Procedures:  Renal ultrasound 01/15/2015  Chest x-ray 01/15/2015  CT abdomen and pelvis 01/23/2015  Antibiotics:  Oral vancomycin 01/15/2015>>>>  IV Rocephin 01/15/2015>>>>  01/16/2015  HPI/Subjective: Patient noted to be combative this morning. Patient currently resting comfortably.  Objective: Filed Vitals:   01/16/15 1413  BP: 125/70  Pulse: 126  Temp: 97.2 F (36.2 C)  Resp: 18    Intake/Output Summary (Last 24 hours) at 01/16/15 1917 Last data filed at 01/16/15 1800  Gross per 24 hour  Intake 2120.03 ml  Output      0 ml  Net 2120.03 ml   Filed Weights   01/15/15 0500  Weight: 73.5 kg (162 lb 0.6 oz)    Exam:   General:  NAD  Cardiovascular: RRR  Respiratory: CTAB anterior lung fields.  Abdomen: Diffuse tenderness to palpation. Soft, positive bowel sounds, no rebound, no guarding  Musculoskeletal: No clubbing cyanosis or edema  Data Reviewed: Basic Metabolic Panel:  Recent Labs Lab 01/28/2015 1839 01/15/15 0648 01/16/15 0410  NA 135 136 135  K 3.4* 3.5 4.4  CL 109 111 113*  CO2 17* 17* 15*  GLUCOSE 112* 105* 113*  BUN 56* 50* 50*  CREATININE 1.58* 1.53* 1.67*  CALCIUM 7.5* 7.4* 7.3*  MG  --   --  1.9   Liver Function Tests:  Recent Labs Lab 01/13/2015 1839 01/15/15 0648 01/16/15 0410  AST 143* 151* 154*  ALT 98* 98* 95*  ALKPHOS 145* 135* 135*  BILITOT 1.2 1.0 1.0  PROT 5.0* 4.9* 4.5*  ALBUMIN 2.0* 1.9* 1.7*   No results for input(s): LIPASE, AMYLASE in the last 168 hours. No results for input(s): AMMONIA in the last 168 hours. CBC:  Recent Labs Lab 01/18/2015 1727 01/15/15 0648 01/16/15 0410  WBC 20.0* 14.9* 21.8*  NEUTROABS  --   --  18.3*  HGB 8.7* 7.2* 6.0*  HCT 26.0* 21.1* 18.4*  MCV 80.5 79.9 82.5  PLT  111* 64* 62*   Cardiac Enzymes: No results for input(s): CKTOTAL, CKMB, CKMBINDEX, TROPONINI in the last 168 hours. BNP (last 3 results) No results for input(s): BNP in the last 8760 hours.  ProBNP (last 3 results) No results for input(s): PROBNP in the last 8760 hours.  CBG:  Recent Labs Lab 01/15/15 0722 01/16/15 0748  GLUCAP 106* 100*    Recent Results (from the past 240  hour(s))  Urine culture     Status: None   Collection Time: 01/27/2015  8:20 PM  Result Value Ref Range Status   Specimen Description URINE, CATHETERIZED  Final   Special Requests NONE  Final   Colony Count NO GROWTH Performed at [facility]   Final   Culture NO GROWTH Performed at [facility]   Final   Report Status 01/16/2015 FINAL  Final  Clostridium Difficile by PCR     Status: Abnormal   Collection Time: 01/15/15  4:30 AM  Result Value Ref Range Status   C difficile by pcr POSITIVE (A) NEGATIVE Final    Comment: CRITICAL RESULT CALLED TO, READ BACK BY AND VERIFIED WITH: Colin Benton RN 11:30 01/15/15 ([email or web address]) Performed at [facility]   Culture, blood (routine x 2)     Status: None (Preliminary result)   Collection Time: 01/15/15  6:45 AM  Result Value Ref Range Status   Specimen Description BLOOD RIGHT ARM  Final   Special Requests BOTTLES DRAWN AEROBIC AND ANAEROBIC 5 CC  Final   Culture   Final           BLOOD CULTURE RECEIVED NO GROWTH TO DATE CULTURE WILL BE HELD FOR 5 DAYS BEFORE ISSUING A FINAL NEGATIVE REPORT Performed at [facility]    Report Status PENDING  Incomplete  Culture, blood (routine x 2)     Status: None (Preliminary result)   Collection Time: 01/15/15  6:48 AM  Result Value Ref Range Status   Specimen Description BLOOD RIGHT HAND  Final   Special Requests BOTTLES DRAWN AEROBIC AND ANAEROBIC 5 CC  Final   Culture   Final           BLOOD CULTURE RECEIVED NO GROWTH TO DATE CULTURE WILL BE HELD FOR 5 DAYS BEFORE ISSUING A FINAL NEGATIVE REPORT Performed at [facility]    Report Status PENDING  Incomplete     Studies: Ct Abdomen Pelvis Wo Contrast  01/29/2015   CLINICAL DATA:  Dizziness for 4 days. Cancer in stomach, liver, and lung. Confusion. New acute abdominal pain for 1 day.  EXAM: CT ABDOMEN AND PELVIS WITHOUT CONTRAST  TECHNIQUE: Multidetector CT imaging of the abdomen and pelvis was performed  following the standard protocol without IV contrast.  COMPARISON:  12/26/2014  FINDINGS: Lung bases demonstrate multiple pulmonary nodules compatible with metastatic disease. Cardiac enlargement with small pericardial effusion.  There is residual contrast material in the kidneys and renal collecting systems from previous CT angio of the chest from earlier today.  Normal multiple diffuse low-attenuation lesions demonstrated throughout the liver, better seen on previous contrast enhanced study. This is consistent with diffuse hepatic metastasis. Multiple solid masses demonstrated in both kidneys consistent with diffuse metastatic disease in the kidneys. No hydronephrosis. Spleen and pancreas are unremarkable. Right adrenal gland nodule measuring 2.4 cm diameter, likely metastatic. Gallbladder, abdominal aorta, and inferior vena cava are unremarkable. Scattered retroperitoneal and mesenteric lymph nodes are not pathologically enlarged. No free fluid or free air in the abdomen. Stomach,  small bowel, and colon are decompressed. No evidence of obstruction but lack of distention limits evaluation for wall thickening.  Pelvis: Heterogeneous thickening of the bladder wall probably represents metastatic disease although primary or inflammatory process could also have this appearance. Uterus and ovaries are not enlarged. Small amount of free fluid in the pelvis. Appendix is not identified. No significant pelvic lymphadenopathy. Degenerative changes in the spine and hips. No destructive bone lesions are appreciated.  IMPRESSION: Diffuse metastatic disease throughout the liver and kidneys as well as right adrenal gland. There is progression since previous study. Small amount of free fluid in the pelvis is likely related to metastatic disease. Asymmetric heterogeneous thickening of the bladder wall may represent metastatic disease although primary lesion or inflammatory process could also have this appearance. No evidence of  bowel obstruction.   Electronically Signed   By: Lucienne Capers M.D.   On: 01/08/2015 23:58   Ct Angio Chest Pe W/cm &/or Wo Cm  01/28/2015   CLINICAL DATA:  Dizziness. Recent diagnosis of stage IV bladder carcinoma with metastatic disease involving the lungs and liver.  EXAM: CT ANGIOGRAPHY CHEST WITH CONTRAST  TECHNIQUE: Multidetector CT imaging of the chest was performed using the standard protocol during bolus administration of intravenous contrast. Multiplanar CT image reconstructions and MIPs were obtained to evaluate the vascular anatomy.  CONTRAST:  69mL OMNIPAQUE IOHEXOL 350 MG/ML SOLN  COMPARISON:  12/25/2014 chest CT and 12/26/2014 abdominal and pelvic CT  FINDINGS: There is progression of metastatic disease throughout the chest since the prior study. Lymphadenopathy in the right axilla, bilateral hilar regions and subcarinal mediastinum has progressed since the prior study. A right hilar lymph node mass shows substantial enlargement and measures 2.3 x 2.9 cm. This previously measured 1.3 x 1.8 cm. Subcarinal lymphadenopathy measures 1.3 cm in short axis. Multiple left hilar lymph nodes appear larger compared to the prior study.  Innumerable bilateral pulmonary metastatic nodules are again identified. All of the metastatic nodules have increased slightly in size since the prior study and multiple nodules demonstrate cavitation. Large nodule in the right upper lobe measures approximately 1.4 cm in greatest diameter. Largest nodule in the lingula of measures approximately 1.5 cm in greatest diameter.  No evidence of acute pulmonary embolism. The thoracic aorta is normal in caliber. No pleural or pericardial fluid is seen. The heart size is normal. No visible bony metastases or fractures.  The visualized upper abdomen shows significant progression of metastatic disease in the liver since a prior CT of the abdomen on 12/26/2014. Diffuse lesions throughout both lobes of the liver show significant  enlargement. There now is confluent metastatic disease in the posterior right lobe of the liver over an area of measuring roughly 7 x 9 cm. Near the dome of the liver anteriorly, a dominant index lesion has increased in diameter from approximately 2.8 cm to 3.9 cm. No visible ascites in the upper abdomen.  Review of the MIP images confirms the above findings.  IMPRESSION: 1. No evidence of pulmonary embolism. 2. Significant progression of metastatic disease in the chest and liver. In the chest, metastatic lymphadenopathy has significantly increased in size and innumerable bilateral pulmonary metastatic nodules have also all increased in size. Many cavitary nodules are again identified. Diffuse metastatic disease throughout the visualized liver has worsened substantially since the prior CT studies.   Electronically Signed   By: Aletta Edouard M.D.   On: 01/10/2015 20:36   US Renal  01/15/2015   CLINICAL DATA:  Acute renal  failure. Known bladder cancer with pulmonary and hepatic metastatic disease.  EXAM: RENAL/URINARY TRACT ULTRASOUND COMPLETE  COMPARISON:  CT scan 01/15/2015  FINDINGS: Right Kidney:  Length: 11.0 cm. Normal renal cortical thickness. Heterogeneous echogenicity with findings suspicious for metastatic lesions. No all hydronephrosis or renal lesion.  Left Kidney:  Length: 11.8 cm. Normal renal cortical thickness. Heterogeneous echogenicity with findings suspicious for metastatic lesions. No hydronephrosis or renal mass.  Bladder:  Not visualized.  IMPRESSION: Heterogeneous echogenicity of the kidneys without definite cysts. Findings suspicious for numerous renal metastasis.  No hydronephrosis.   Electronically Signed   By: Marijo Sanes M.D.   On: 01/15/2015 15:51    Scheduled Meds: . sodium chloride   Intravenous Once  . haloperidol lactate  1 mg Intravenous 4 times per day  . scopolamine  1 patch Transdermal Q72H  . sodium chloride  3 mL Intravenous Q12H  . vancomycin  125 mg Oral QID    Continuous Infusions: . morphine 1 mg/hr (01/16/15 1233)    Principal Problem:   Sepsis Active Problems:   C. difficile colitis   HTN (hypertension)   Adjustment disorder with mixed anxiety and depressed mood   Palliative care encounter   Stage IV bladder cancer   UTI (lower urinary tract infection)   Hypokalemia   AKI (acute kidney injury)   Anemia   Thrombocytopenia   ARF (acute renal failure)    Time spent: 92 minutes    THOMPSON,DANIEL M.D. [facility] Pager [phone]. If 7PM-7AM, please contact night-coverage at [email or web address], password [facility] 01/16/2015, 7:17 PM  LOS: 1 day

## 2015-01-20 ENCOUNTER — Other Ambulatory Visit: Payer: Self-pay

## 2015-01-20 ENCOUNTER — Ambulatory Visit: Payer: Self-pay | Admitting: Oncology

## 2015-01-20 ENCOUNTER — Ambulatory Visit: Payer: Self-pay

## 2015-01-21 LAB — CULTURE, BLOOD (ROUTINE X 2)
Culture: NO GROWTH
Culture: NO GROWTH

## 2015-01-26 ENCOUNTER — Encounter (HOSPITAL_COMMUNITY): Admission: RE | Payer: Self-pay | Source: Ambulatory Visit

## 2015-01-26 ENCOUNTER — Ambulatory Visit (HOSPITAL_COMMUNITY): Admission: RE | Admit: 2015-01-26 | Payer: MEDICAID | Source: Ambulatory Visit | Admitting: Urology

## 2015-01-26 SURGERY — TURBT (TRANSURETHRAL RESECTION OF BLADDER TUMOR)
Anesthesia: General

## 2015-02-03 NOTE — Progress Notes (Signed)
Contacted son, Elta Guadeloupe, via phone to notify of change in pt status. Dr. Grandville Silos remains on unit and spoke with [facility].

## 2015-02-03 NOTE — Discharge Summary (Signed)
Death Summary  Taylor Foley PNT:[id number] DOB: 11-11-1951 DOA: 01/15/15  PCP: No PCP Per Patient PCP/Office notified:   Admit date: 01-15-2015 Date of Death: 01/18/2015  Final Diagnoses:  Principal Problem:   Sepsis Active Problems:   Stage IV bladder cancer   C. difficile colitis   HTN (hypertension)   Adjustment disorder with mixed anxiety and depressed mood   Palliative care encounter   UTI (lower urinary tract infection)   Hypokalemia   AKI (acute kidney injury)   Anemia   Thrombocytopenia   ARF (acute renal failure)      History of present illness:  Taylor Foley is a 64 y.o. female who presented to the ED with R sided chest and shoulder pain. Worse with shoulder movement. Better with rest. Patient also had baseline SOB, taking no meds for last several months. Noticed blood in urine, unable to state for how long. Legs were red, swollen and painful, also unable to tell us for how long. Patient also suspected that "something may be wrong with my head".   Hospital Course:  #1 sepsis On admission patient was noted to be sepsis felt to be multifactorial secondary to acute C. difficile colitis and urinary tract infection. Patient was pancultured and cultures were pending. Patient was placed empirically on oral vancomycin. IV antibiotics which was started for UTI was subsequently discontinued after palliative care meeting.  Palliative care has assessed the patient and in discussions with the family goal was for comfort. Patient was placed on a morphine drip as well as a scopolamine patch and Haldol as needed for agitation. Patient was going to be transferred to a hospice home. Patient was subsequently pronounced dead at 11:55 AM on 2015-01-18.  #2 C. difficile colitis On admission patient was noted to have diarrhea. C. difficile PCR was sent off which came back positive. Patient was placed on oral vancomycin. Patient was seen in consultation by palliative care.  Focus was comfort approach. Patient was subsequently pronounced dead at 11:55 AM on 01/18/2015.  #3 UTI On admission urinalysis which was done was consistent with a UTI. Patient was initially placed empirically on IV Rocephin. Patient was seen in consultation by palliative care and goals of care were obtained. Patient was made of focal foot approach. IV antibiotics were discontinued. Patient was subsequently pronounced dead at 11:55 AM on January 18, 2015.   #4 acute renal failure Likely secondary to a prerenal azotemia. Patient was initially placed on IV fluids with no significant improvement with her renal function. Patient was seen in consultation by palliative care and made a full comfort. Patient subsequently was pronounced dead at 11:55 AM on 01/18/2015.  #5 hypokalemia Patient was noted to be hypokalemic June the hospitalization which was felt to be secondary to GI losses. Patient's potassium was repleted.   #6 hypertension Remained stable.  #7 anemia Likely anemia of chronic disease. Patient had no overt bleeding. Patient's hemoglobin dropped as low as 6. Patient was noted to be combative. Patient was likely in the terminal delirium. Patient was seen in consultation by palliative care and goals of care was comfort measures only. Blood draws were discontinued. Patient was placed on the morphine drip, scopolamine patch. Patient was made comfortable. Patient was pronounced dead at 11:55 AM on 18-Jan-2015.   #8 metastatic stage IV bladder cancer Patient with metastases to the liver kidneys adrenal glands. Patient has been deteriorating with a poor prognosis. Patient was seen in consultation by palliative care for goals of care and a focal comfort approach. Patient  was placed on scopolamine patch as well as a morphine drip. Patient was made comfortable and patient was to be transitioned to hospice home. Patient was pronounced dead prior to transfer to hospice home at 11:55 AM.  May her soul rest in  peace.     Time: 65 mins  Signed:  THOMPSON,DANIEL  [facility] Feb 10, 2015, 12:29 PM

## 2015-02-03 NOTE — Progress Notes (Signed)
Dr. Grandville Silos on rounds to prepare pt for transition to [facility]. Pt recently bathed and turned. Currently pt has no signs of breathing and is pulseless. Auscultation confirmed no heart beat. Pt's death pronounced by physician. Morphine drip stopped and IV's dc'd.

## 2015-02-03 NOTE — Progress Notes (Signed)
Son, Elta Guadeloupe, and pt's sister in to see pt. Condolences extended. Sister requested we wait for a niece to come by before calling funeral services.

## 2015-02-03 NOTE — Progress Notes (Signed)
Niece in to see pt. Condolences extended. Bed placement notified to call [facility] per request of son, Elta Guadeloupe.

## 2015-02-03 NOTE — Progress Notes (Signed)
Post mortem care started yet waiting on family to come visit.

## 2015-02-03 NOTE — Clinical Social Work Note (Signed)
CSW received a call from MD that pt had passed away  CSW provided phone number of son to RN and MD and they let pt's son know about pt's passing  [place] was notified of pt passing  No further CSW needs.  Dede Query, LCSW [place] Worker - Weekend Coverage cell #: [phone]

## 2015-02-03 NOTE — Progress Notes (Signed)
[employer] in to [employer]or, mining to [employer]. Death certificate with body.

## 2015-02-03 NOTE — Progress Notes (Signed)
Remaining Morphine drip bolus equaled 225 ml -wasted in sink and verified with Althea Charon, RN. Pharmacy notified.

## 2015-02-03 NOTE — Consult Note (Signed)
[facility] Liaison: [place] room available for patient today. Met with patient's son Taylor Foley to complete paper work yesterday. Please fax discharge summary to [phone]. RN please call report to [phone]. Please arrange transport for patient to arrive as early in day as possible. Thank you. Erling Conte, [place]

## 2015-02-03 DEATH — deceased

## 2016-01-10 IMAGING — CR DG HIP (WITH OR WITHOUT PELVIS) 2-3V*R*
3 series · 3 of 3 positions shown · non-contrast
Comparison: None.

CLINICAL DATA: Right hip pain, acute.  Initial encounter.

EXAM:
DG HIP W/ PELVIS 2-3V*R*

[t pelvis ap]
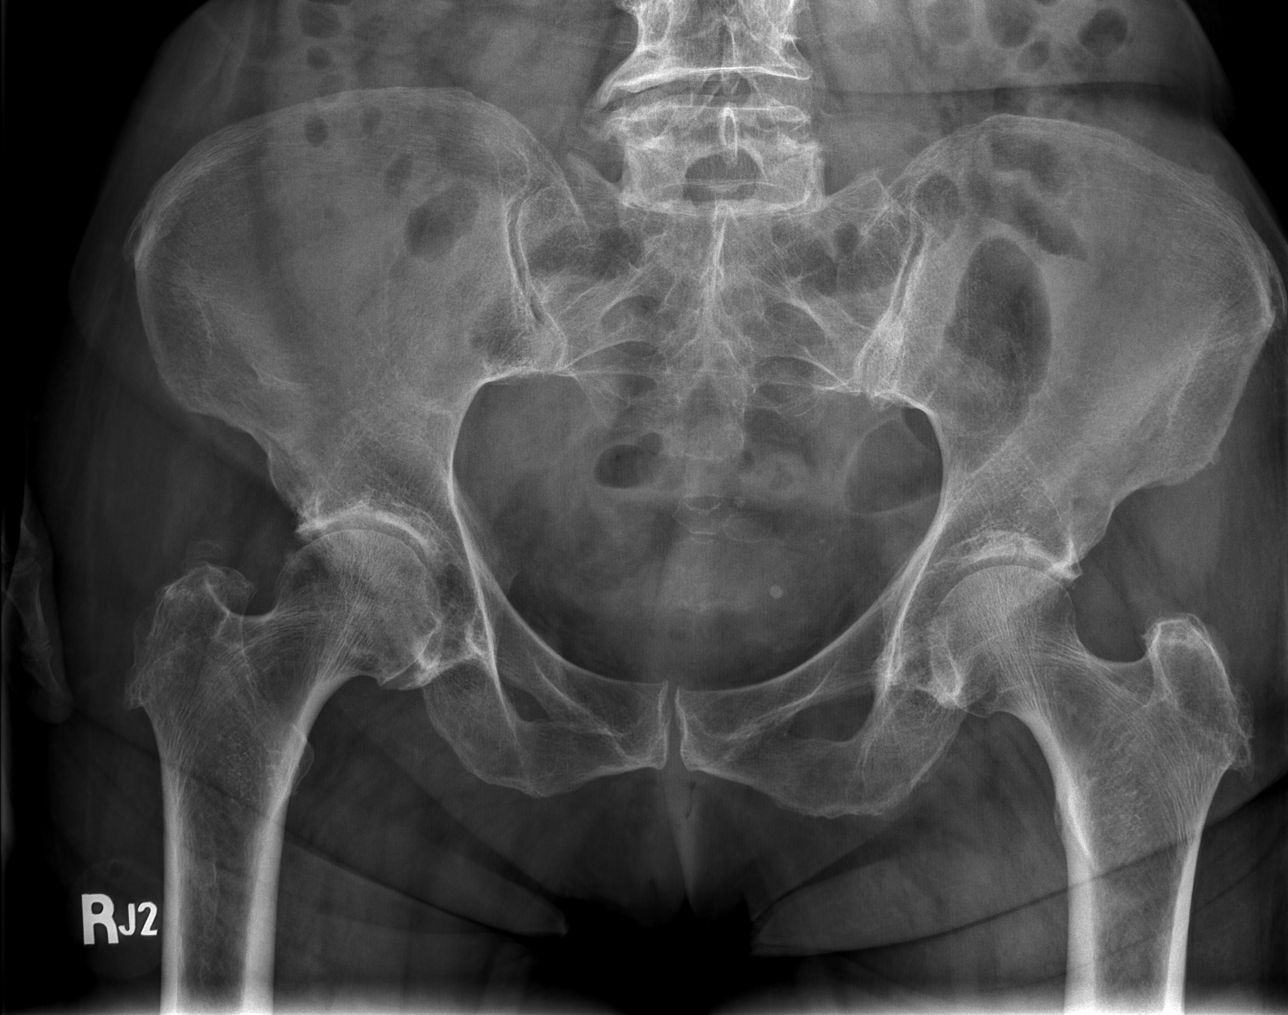

[t hip ap right]
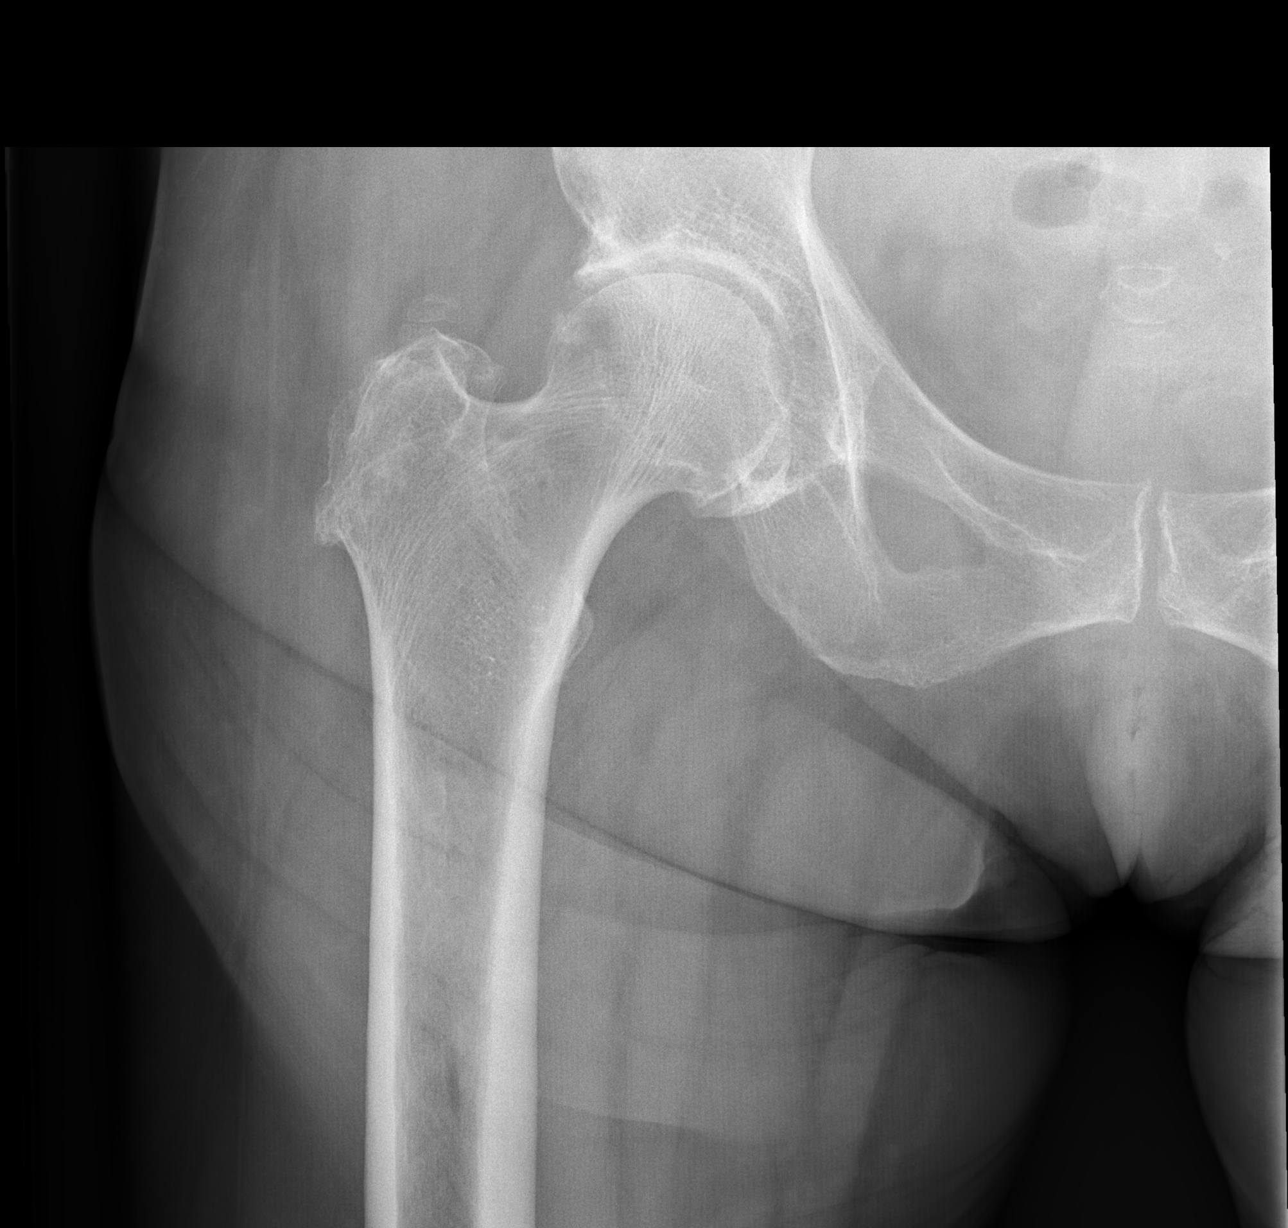

[t hip frog leg right]
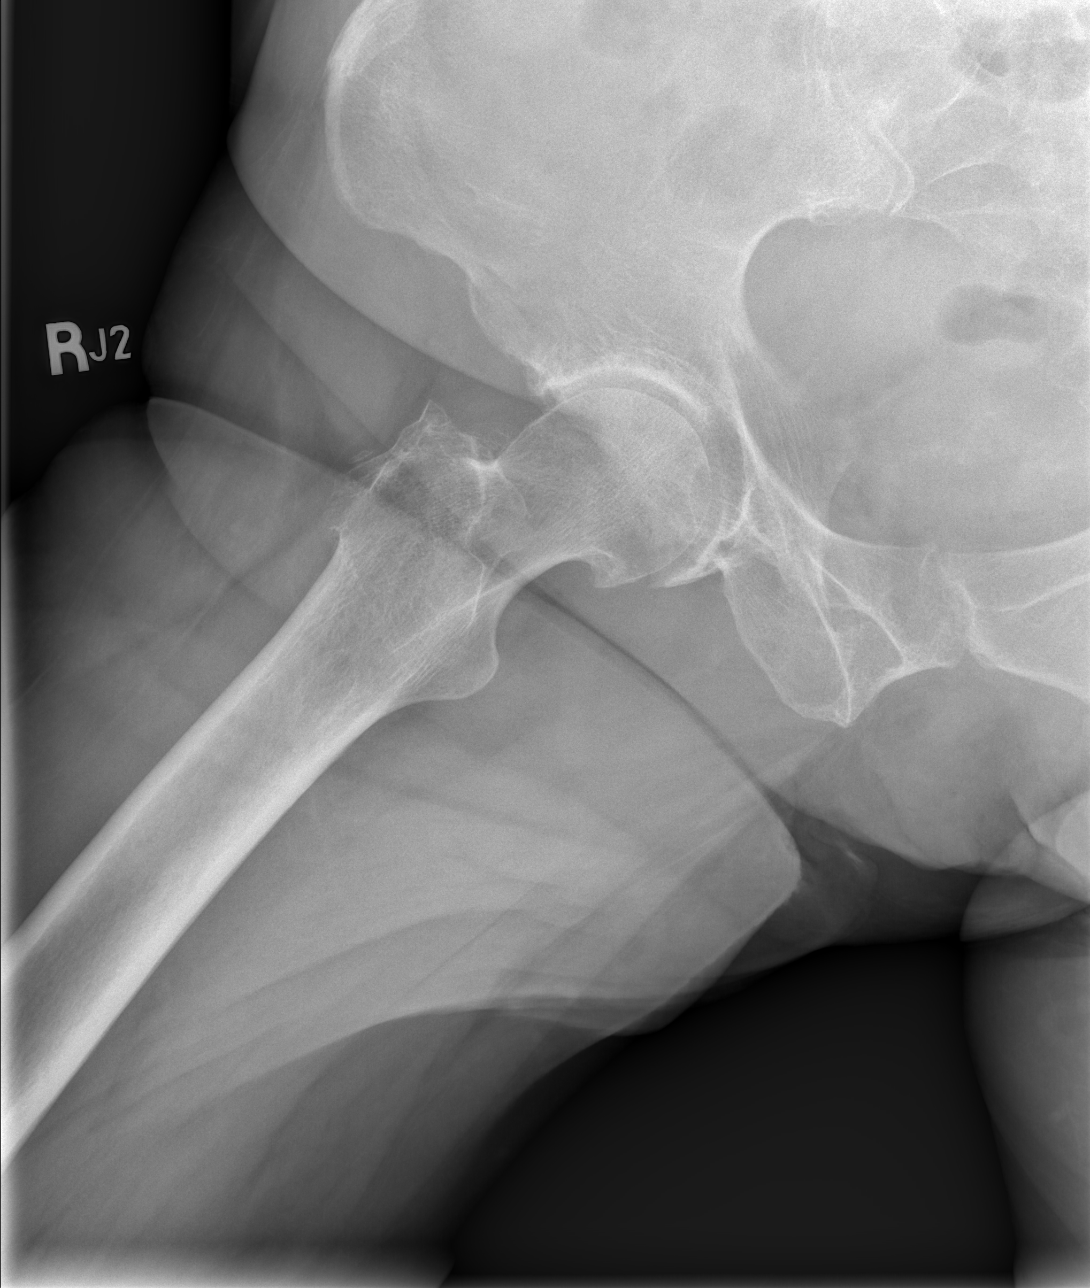

[3 of 3 positions shown; findings below may reference images not displayed]

FINDINGS: Negative for fracture or dislocation about the right hip. There are
moderate arthritic changes, with joint space narrowing and
subchondral sclerosis as well as small osteophyte formation. There
are calcifications around the greater trochanter consistent with
prior trochanteric bursitis. No bone lesion or bony destruction is
evident.
IMPRESSION: Moderate right hip arthritis. Probable trochanteric bursitis.
Negative for fracture, dislocation or bony destruction.

## 2016-01-24 IMAGING — CT CT ABD-PELV W/O CM
1 of 2 series · 14 of 32 positions shown, 18 images · non-contrast
Comparison: 12/26/2014

CLINICAL DATA: Dizziness for 4 days. Cancer in stomach, liver, and
lung. Confusion. New acute abdominal pain for 1 day.

EXAM:
CT ABDOMEN AND PELVIS WITHOUT CONTRAST
TECHNIQUE: Multidetector CT imaging of the abdomen and pelvis was performed
following the standard protocol without IV contrast.

[Series 2: abd/pel w/o · axial · non-contrast · 0.74mm/px · z∈[-576,-161]mm · 14 of 95 slices shown, 18 images]
[im 8/95  soft-tissue]
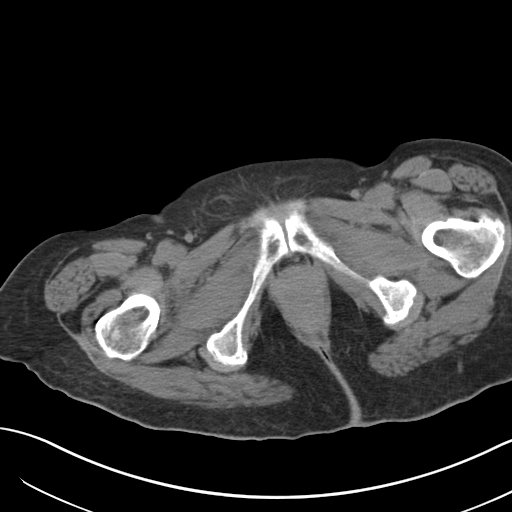
[im 8/95  bone]
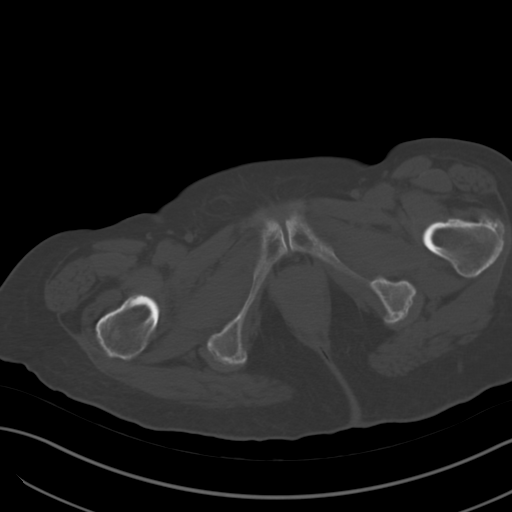
[im 16/95  soft-tissue]
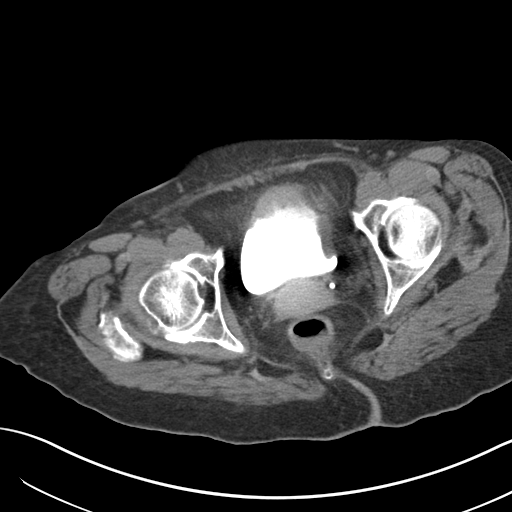
[im 23/95  soft-tissue]
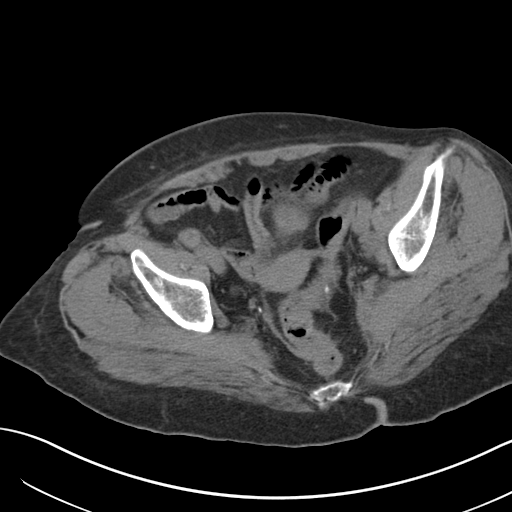
[im 31/95  soft-tissue]
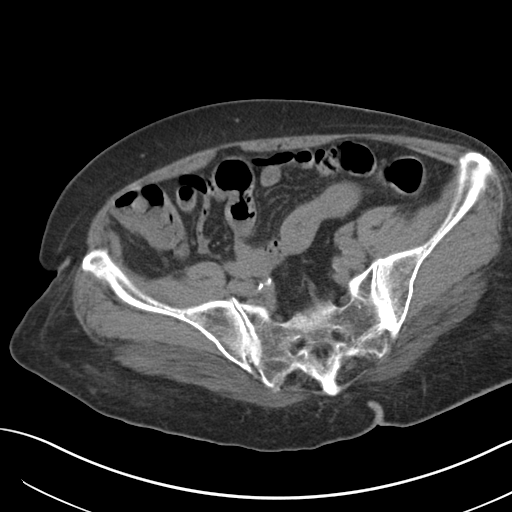
[im 38/95  soft-tissue]
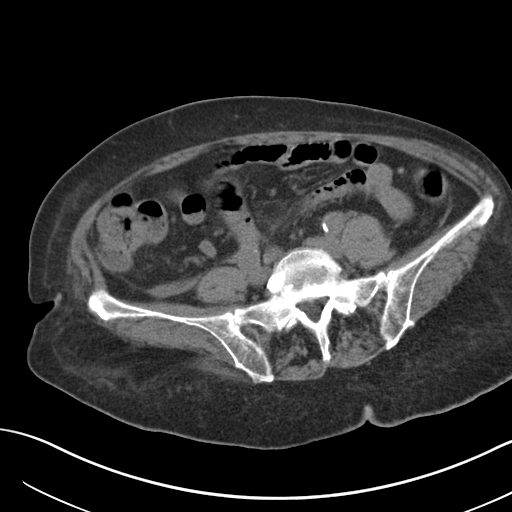
[im 46/95  soft-tissue]
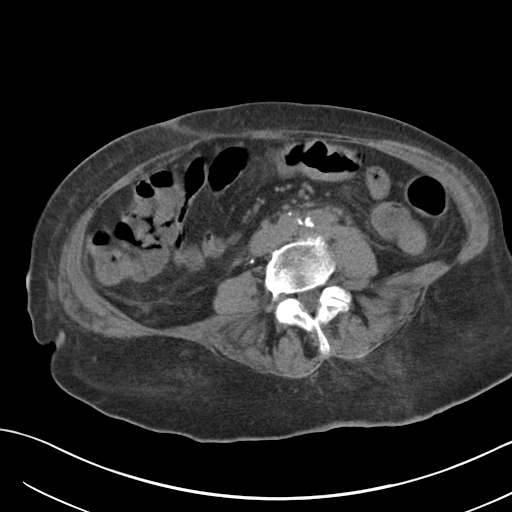
[im 53/95  soft-tissue]
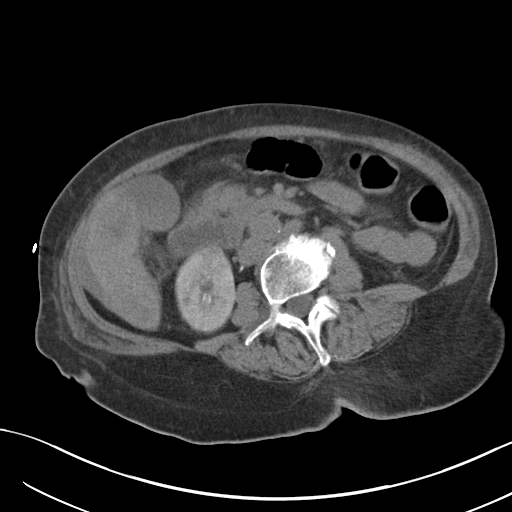
[im 61/95  soft-tissue]
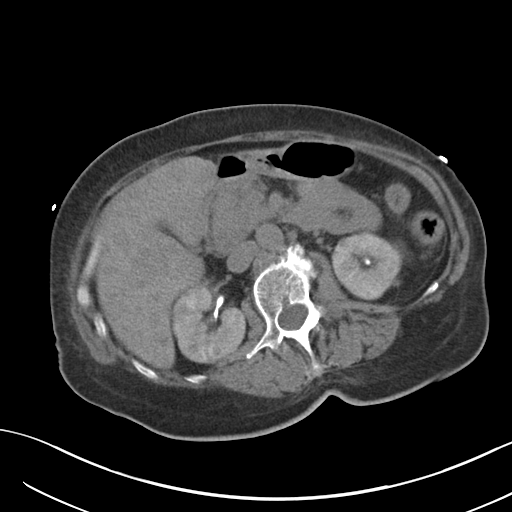
[im 68/95  soft-tissue]
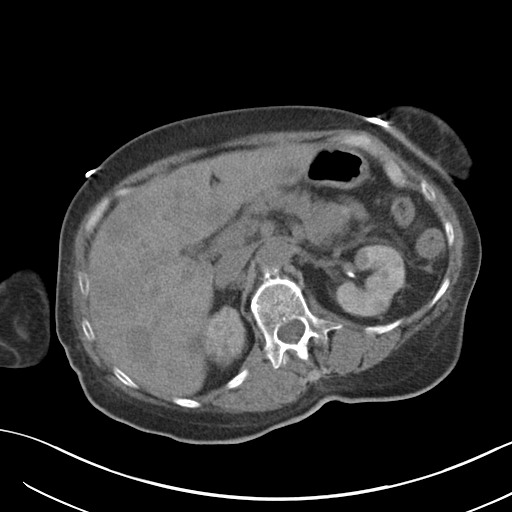
[im 68/95  bone]
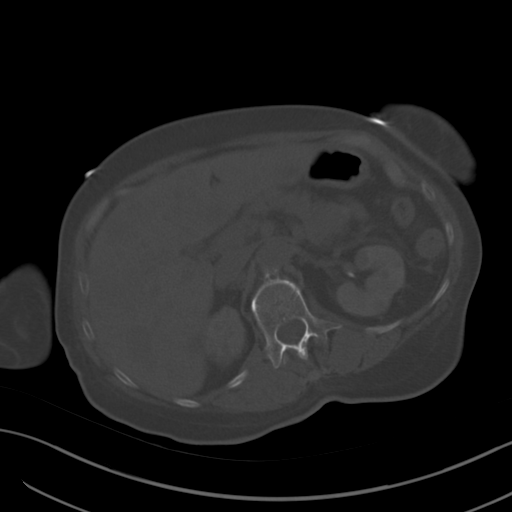
[im 76/95  soft-tissue]
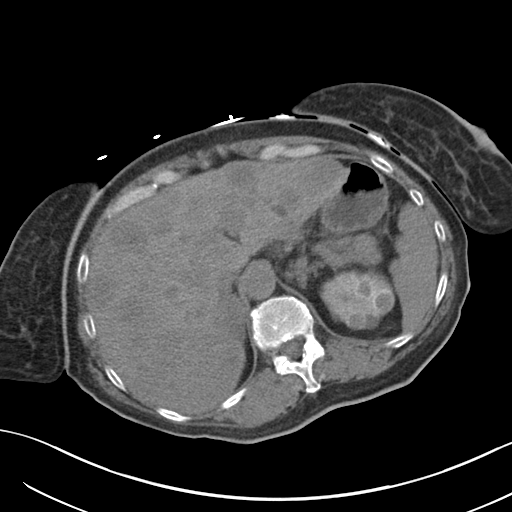
[im 79/95  lung]
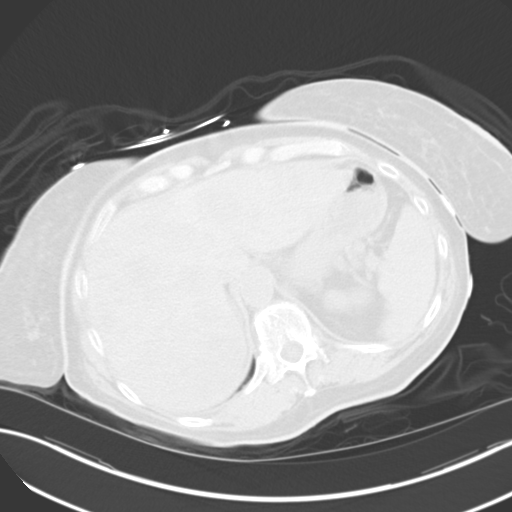
[im 83/95  soft-tissue]
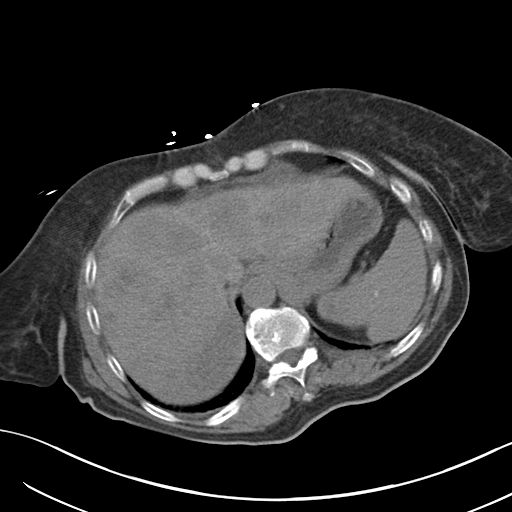
[im 83/95  lung]
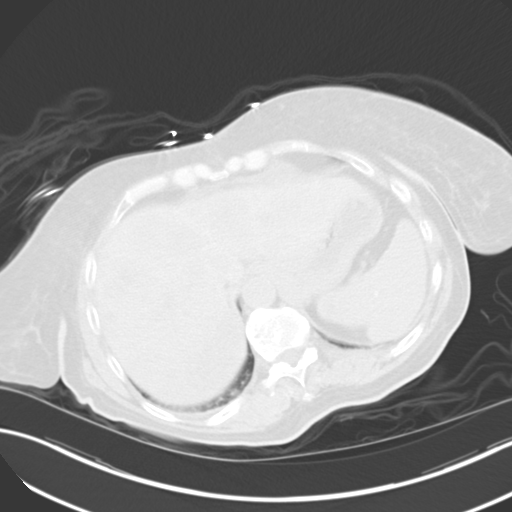
[im 87/95  lung]
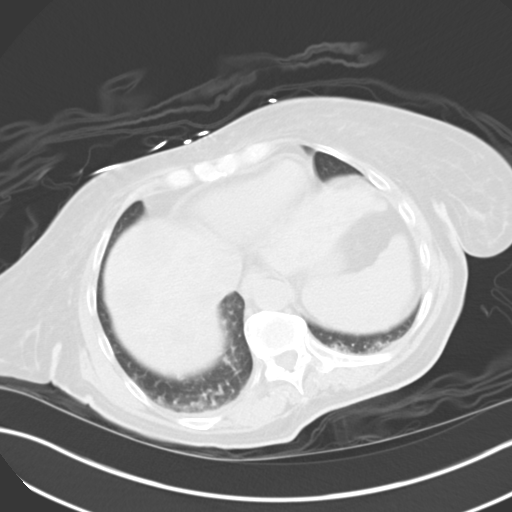
[im 91/95  soft-tissue]
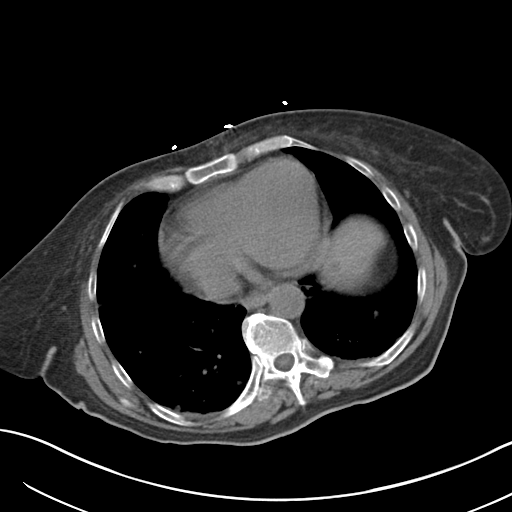
[im 91/95  lung]
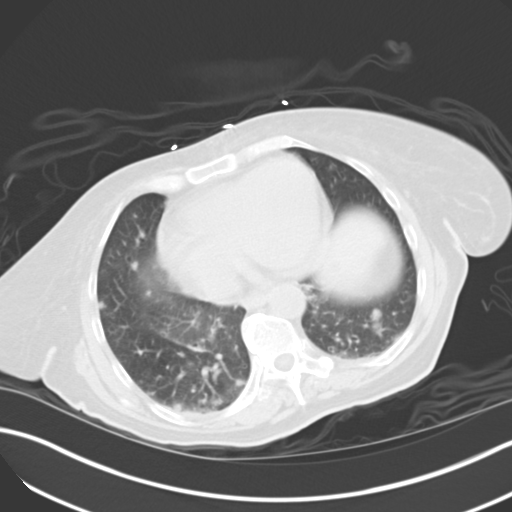

[14 of 32 positions shown; findings below may reference images not displayed]

FINDINGS: Lung bases demonstrate multiple pulmonary nodules compatible with
metastatic disease. Cardiac enlargement with small pericardial
effusion.

There is residual contrast material in the kidneys and renal
collecting systems from previous CT angio of the chest from earlier
today.

Normal multiple diffuse low-attenuation lesions demonstrated
throughout the liver, better seen on previous contrast enhanced
study. This is consistent with diffuse hepatic metastasis. Multiple
solid masses demonstrated in both kidneys consistent with diffuse
metastatic disease in the kidneys. No hydronephrosis. Spleen and
pancreas are unremarkable. Right adrenal gland nodule measuring
cm diameter, likely metastatic. Gallbladder, abdominal aorta, and
inferior vena cava are unremarkable. Scattered retroperitoneal and
mesenteric lymph nodes are not pathologically enlarged. No free
fluid or free air in the abdomen. Stomach, small bowel, and colon
are decompressed. No evidence of obstruction but lack of distention
limits evaluation for wall thickening.

Pelvis: Heterogeneous thickening of the bladder wall probably
represents metastatic disease although primary or inflammatory
process could also have this appearance. Uterus and ovaries are not
enlarged. Small amount of free fluid in the pelvis. Appendix is not
identified. No significant pelvic lymphadenopathy. Degenerative
changes in the spine and hips. No destructive bone lesions are
appreciated.
IMPRESSION: Diffuse metastatic disease throughout the liver and kidneys as well
as right adrenal gland. There is progression since previous study.
Small amount of free fluid in the pelvis is likely related to
metastatic disease. Asymmetric heterogeneous thickening of the
bladder wall may represent metastatic disease although primary
lesion or inflammatory process could also have this appearance. No
evidence of bowel obstruction.
